# Patient Record
Sex: Male | Born: 1995 | Race: White | Hispanic: No | Marital: Single | Smoking: Never smoker
Health system: Southern US, Community
[De-identification: ages and names within clinical notes are randomized; demographics above are authoritative.]

## PROBLEM LIST (undated history)

## (undated) ENCOUNTER — Emergency Department (HOSPITAL_COMMUNITY): Discharge status: Against Medical Advice | Payer: 59

## (undated) DIAGNOSIS — G4700 Insomnia, unspecified: Secondary | ICD-10-CM

## (undated) DIAGNOSIS — I10 Essential (primary) hypertension: Secondary | ICD-10-CM

## (undated) DIAGNOSIS — F411 Generalized anxiety disorder: Secondary | ICD-10-CM

## (undated) DIAGNOSIS — R945 Abnormal results of liver function studies: Secondary | ICD-10-CM

## (undated) DIAGNOSIS — F329 Major depressive disorder, single episode, unspecified: Secondary | ICD-10-CM

## (undated) DIAGNOSIS — R45851 Suicidal ideations: Secondary | ICD-10-CM

## (undated) DIAGNOSIS — E669 Obesity, unspecified: Secondary | ICD-10-CM

## (undated) DIAGNOSIS — R7989 Other specified abnormal findings of blood chemistry: Secondary | ICD-10-CM

## (undated) DIAGNOSIS — N200 Calculus of kidney: Secondary | ICD-10-CM

## (undated) DIAGNOSIS — Q549 Hypospadias, unspecified: Secondary | ICD-10-CM

## (undated) DIAGNOSIS — F3132 Bipolar disorder, current episode depressed, moderate: Secondary | ICD-10-CM

## (undated) HISTORY — PX: LITHOTRIPSY: SUR834

## (undated) HISTORY — DX: Suicidal ideations: R45.851

## (undated) HISTORY — DX: Major depressive disorder, single episode, unspecified: F32.9

## (undated) HISTORY — DX: Hypospadias, unspecified: Q54.9

## (undated) HISTORY — PX: WISDOM TOOTH EXTRACTION: SHX21

## (undated) HISTORY — DX: Other specified abnormal findings of blood chemistry: R79.89

## (undated) HISTORY — PX: LIVER BIOPSY: SHX301

## (undated) HISTORY — DX: Abnormal results of liver function studies: R94.5

---
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
## 2014-08-16 ENCOUNTER — Ambulatory Visit: Payer: Managed Care, Other (non HMO) | Admitting: Medical

## 2014-08-20 ENCOUNTER — Encounter: Payer: Self-pay | Admitting: Internal Medicine

## 2014-08-20 ENCOUNTER — Ambulatory Visit (INDEPENDENT_AMBULATORY_CARE_PROVIDER_SITE_OTHER): Payer: 59 | Admitting: Internal Medicine

## 2014-08-20 ENCOUNTER — Ambulatory Visit (HOSPITAL_BASED_OUTPATIENT_CLINIC_OR_DEPARTMENT_OTHER)
Admission: RE | Admit: 2014-08-20 | Discharge: 2014-08-20 | Disposition: A | Discharge status: Home / Self Care | Payer: 59 | Source: Ambulatory Visit | Attending: Internal Medicine | Admitting: Internal Medicine

## 2014-08-20 VITALS — BP 126/78 | HR 97 | Temp 98.3°F | Ht 71.0 in | Wt 326.0 lb

## 2014-08-20 DIAGNOSIS — R079 Chest pain, unspecified: Secondary | ICD-10-CM

## 2014-08-20 DIAGNOSIS — J9811 Atelectasis: Secondary | ICD-10-CM | POA: Insufficient documentation

## 2014-08-20 NOTE — Progress Notes (Signed)
   Subjective:    Patient ID: [name], male    DOB: 08-29-1995, 19 y.o.   MRN: [id number]  DOS:  08/20/2014 Type of visit - description : Acute Interval history: He has 3 different pains, the one that triggered this office visit is the right-sided pain. The pain is located at the distal right rib cage, going on for the last 3 weeks, sometimes worse when he moves sometimes even when he is not moving, last 2 or 3 minutes, no change with eating.  Also having pain and the left lower quadrant of the abdomen since last year, see my note from 2015. Again symptoms are on and off, recently they are mild.  Also complained of an anterior upper chest pain when he lays on his back, no exertional symptoms, pain last about 10 minutes   Review of Systems No fever chills No difficulty breathing, no lower extremity edema or calf pain. Occasionally has nausea but no vomiting, no diarrhea or actual heartburn. No dysuria, gross hematuria difficulty urinating No rash.  Past Medical History  Diagnosis Date  . Major depression      was admited to [facility] x 1 week, [name]  is his psychiatrist-- Dx made 01-2007, started on meds then  . Hypospadias   . Liver function test abnormality     h/o  . Kidney stone     x4; 2 at age 17, lithotrypsy 16-2015  . Obesity     Past Surgical History  Procedure Laterality Date  . Wisdom tooth extraction      History   Social History  . Marital Status: Single    Spouse Name: N/A  . Number of Children: 0  . Years of Education: N/A   Occupational History  . student    Social History Main Topics  . Smoking status: Never Smoker   . Smokeless tobacco: Never Used  . Alcohol Use: No  . Drug Use: No  . Sexual Activity: Not on file   Other Topics Concern  . Not on file   Social History Narrative        Medication List    Notice  As of 08/20/2014 11:59 PM   You have not been prescribed any medications.         Objective:   Physical Exam    Abdominal:     BP 126/78 mmHg  Pulse 97  Temp(Src) 98.3 F (36.8 C) (Oral)  Ht  (1.803 m)  Wt 326 lb (147.873 kg)  BMI 45.49 kg/m2  SpO2 97% General:   Well developed, well nourished . NAD.  HEENT:  Normocephalic . Face symmetric, atraumatic Lungs:  CTA B Normal respiratory effort, no intercostal retractions, no accessory muscle use. Heart: RRR,  no murmur.  no pretibial edema bilaterally  Abdomen:  Not distended, soft, non-tender. No rebound or rigidity. No mass,organomegaly Skin: Not pale. Not jaundice Neurologic:  alert & oriented X3.  Speech normal, gait appropriate for age and unassisted Psych--  Cognition and judgment appear intact.  Cooperative with normal attention span and concentration.  Behavior appropriate. No anxious or depressed appearing.    Assessment & Plan:   Right-sided chest pain, chronic left lower abdominal pain, upper anterior chest pain. Multiple pains, exam is benign, for completeness we'll get a chest x-ray. If x-rays normal will recommend observation. Asked to call me if the pain increases,  he has a rash, right side pain becomes related to food, etc .

## 2014-08-20 NOTE — Progress Notes (Signed)
Pre visit review using our clinic review tool, if applicable. No additional management support is needed unless otherwise documented below in the visit note. 

## 2014-08-20 NOTE — Patient Instructions (Signed)
   Stop by the first floor and get the XR    Call if not gradually improving. Call if you have severe symptoms, a rash, problems with eating

## 2014-10-18 ENCOUNTER — Telehealth: Payer: Self-pay | Admitting: Behavioral Health

## 2014-10-18 NOTE — Telephone Encounter (Signed)
Unable to reach patient/mother at time of Pre-Visit Call.  Left message for patient/mother to return call when available.

## 2014-10-19 ENCOUNTER — Ambulatory Visit (INDEPENDENT_AMBULATORY_CARE_PROVIDER_SITE_OTHER): Payer: Managed Care, Other (non HMO) | Admitting: Internal Medicine

## 2014-10-19 ENCOUNTER — Encounter: Payer: Self-pay | Admitting: Internal Medicine

## 2014-10-19 VITALS — BP 124/76 | HR 91 | Temp 98.1°F | Ht 71.0 in | Wt 320.0 lb

## 2014-10-19 DIAGNOSIS — Z0000 Encounter for general adult medical examination without abnormal findings: Secondary | ICD-10-CM | POA: Insufficient documentation

## 2014-10-19 DIAGNOSIS — Z23 Encounter for immunization: Secondary | ICD-10-CM

## 2014-10-19 DIAGNOSIS — Z09 Encounter for follow-up examination after completed treatment for conditions other than malignant neoplasm: Secondary | ICD-10-CM

## 2014-10-19 LAB — CBC WITH DIFFERENTIAL/PLATELET
BASOS PCT: 1 % (ref 0–1)
Basophils Absolute: 0.1 10*3/uL (ref 0.0–0.1)
EOS PCT: 1 % (ref 0–5)
Eosinophils Absolute: 0.1 10*3/uL (ref 0.0–0.7)
HCT: 46.1 % (ref 39.0–52.0)
Hemoglobin: 15.6 g/dL (ref 13.0–17.0)
Lymphocytes Relative: 43 % (ref 12–46)
Lymphs Abs: 3.5 10*3/uL (ref 0.7–4.0)
MCH: 29.2 pg (ref 26.0–34.0)
MCHC: 33.8 g/dL (ref 30.0–36.0)
MCV: 86.3 fL (ref 78.0–100.0)
MONO ABS: 0.7 10*3/uL (ref 0.1–1.0)
MPV: 9.7 fL (ref 8.6–12.4)
Monocytes Relative: 9 % (ref 3–12)
Neutro Abs: 3.8 10*3/uL (ref 1.7–7.7)
Neutrophils Relative %: 46 % (ref 43–77)
Platelets: 303 10*3/uL (ref 150–400)
RBC: 5.34 MIL/uL (ref 4.22–5.81)
RDW: 13.8 % (ref 11.5–15.5)
WBC: 8.2 10*3/uL (ref 4.0–10.5)

## 2014-10-19 LAB — COMPREHENSIVE METABOLIC PANEL
ALT: 113 U/L — ABNORMAL HIGH (ref 9–46)
AST: 49 U/L — ABNORMAL HIGH (ref 10–40)
Albumin: 4.4 g/dL (ref 3.6–5.1)
Alkaline Phosphatase: 48 U/L (ref 40–115)
BUN: 11 mg/dL (ref 7–25)
CO2: 22 mmol/L (ref 20–31)
CREATININE: 0.67 mg/dL (ref 0.60–1.35)
Calcium: 9.2 mg/dL (ref 8.6–10.3)
Chloride: 104 mmol/L (ref 98–110)
Glucose, Bld: 79 mg/dL (ref 65–99)
POTASSIUM: 4.2 mmol/L (ref 3.5–5.3)
SODIUM: 136 mmol/L (ref 135–146)
TOTAL PROTEIN: 6.9 g/dL (ref 6.1–8.1)
Total Bilirubin: 0.6 mg/dL (ref 0.2–1.2)

## 2014-10-19 NOTE — Progress Notes (Signed)
Subjective:    Patient ID: [name], male    DOB: 1995/02/27, 19 y.o.   MRN: [id number]  DOS:  10/19/2014 Type of visit - description : cpx Interval history: no concerns    Review of Systems  Constitutional: No fever. No chills. No unexplained wt changes. No unusual sweats  HEENT: No dental problems, no ear discharge, no facial swelling, no voice changes. No eye discharge, no eye  redness , no  intolerance to light   Respiratory: No wheezing , no  difficulty breathing. No cough , no mucus production  Cardiovascular: No CP, no leg swelling , no  Palpitations  GI: no nausea, no vomiting, no diarrhea , no  abdominal pain.  No blood in the stools. No dysphagia, no odynophagia    Endocrine: No polyphagia, no polyuria , no polydipsia  GU: No dysuria, gross hematuria, difficulty urinating. No urinary urgency, no frequency.  Musculoskeletal: No joint swellings or unusual aches or pains  Skin: No change in the color of the skin, palor , no  Rash  Allergic, immunologic: No environmental allergies , no  food allergies  Neurological: No dizziness no  syncope. No headaches. No diplopia, no slurred, no slurred speech, no motor deficits, no facial  Numbness  Hematological: No enlarged lymph nodes, no easy bruising , no unusual bleedings  Psychiatry: No suicidal ideas, no hallucinations, no beavior problems, no confusion.  No unusual/severe anxiety, no depression    Past Medical History  Diagnosis Date  . Major depression (HCC)      was admited to [facility] x 1 week, [name]  is his psychiatrist-- Dx made 01-2007, started on meds then  . Hypospadias   . Liver function test abnormality     h/o  . Kidney stone     x4; 2 at age 65, lithotrypsy 62-2015  . Obesity     Past Surgical History  Procedure Laterality Date  . Wisdom tooth extraction      Social History   Social History  . Marital Status: Single    Spouse Name: N/A  . Number of Children: 0  . Years of  Education: N/A   Occupational History  . to start school in 01-2015 [employer] [facility])    Social History Main Topics  . Smoking status: Never Smoker   . Smokeless tobacco: Never Used  . Alcohol Use: No  . Drug Use: No  . Sexual Activity: Not on file   Other Topics Concern  . Not on file   Social History Narrative   Lives w/ parents, not working     Family History  Problem Relation Age of Onset  . Breast cancer Paternal Grandmother   . Prostate cancer Paternal Grandfather   . Diabetes Paternal Grandmother   . Diabetes Maternal Grandmother   . Heart disease Father   . Heart disease Paternal Grandfather   . Heart disease Paternal Grandmother   . Heart disease Maternal Grandfather   . Colon cancer Neg Hx        Medication List    Notice  As of 10/19/2014 11:59 PM   You have not been prescribed any medications.         Objective:   Physical Exam BP 124/76 mmHg  Pulse 91  Temp(Src) 98.1 F (36.7 C) (Oral)  Ht  (1.803 m)  Wt 320 lb (145.151 kg)  BMI 44.65 kg/m2  SpO2 97% General:   Well developed, well nourished . NAD.  HEENT:  Normocephalic . Face  symmetric, atraumatic  neck: No thyromegaly Lungs:  CTA B Normal respiratory effort, no intercostal retractions, no accessory muscle use. Heart: RRR,  no murmur.  no pretibial edema bilaterally  Abdomen:  Not distended, soft, non-tender. No rebound or rigidity. No mass,organomegaly Skin: Not pale. Not jaundice Neurologic:  alert & oriented X3.  Speech normal, gait appropriate for age and unassisted Psych--  Cognition and judgment appear intact.  Cooperative with normal attention span and concentration.  Behavior appropriate. No anxious or depressed appearing.     Assessment & Plan:   Assessment > Major depression  -- was admited to [facility] x 1 week, [name]  is his psychiatrist-- Dx made 01-2007, was rx meds then H/o Hypospadias H/o hypogonadism, pituitary insufficiency, used to see Dr. Fransico Michael or Dr  Everardo All NASH: H/o increase LFTs, saw GI 2014 >> Bx + for NASH, rx wt loss , vit e 800 u Urolithiasis --x4; 2 at age 41, lithotrypsy 01-2013  Plan Elita Boone: Encourage weight loss, recheck labs History of hypogonadism, pituitary insufficiency: Check labs, refer to endocrinology if needed RTC 6 months

## 2014-10-19 NOTE — Progress Notes (Signed)
Pre visit review using our clinic review tool, if applicable. No additional management support is needed unless otherwise documented below in the visit note. 

## 2014-10-19 NOTE — Assessment & Plan Note (Addendum)
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 Will check a cholesterol panel when he comes back fasting in 6 months Diet and exercise discussed Consulate about safe sex and STE

## 2014-10-19 NOTE — Patient Instructions (Addendum)
Get your blood work before you leave   Come back for your next shot in one month and again in 6 months    Next visit  for a  routine checkup in 6 months, fasting.   Please schedule an appointment at the front desk    Testicular Self-Exam A self-examination of your testicles involves looking at and feeling your testicles for abnormal lumps or swelling. Several things can cause swelling, lumps, or pain in your testicles. Some of these causes are:  Injuries.  Inflammation.  Infection.  Accumulation of fluids around your testicle (hydrocele).  Twisted testicles (testicular torsion).  Testicular cancer. Self-examination of the testicles and groin areas may be advised if you are at risk for testicular cancer. Risks for testicular cancer include:  An undescended testicle (cryptorchidism).  A history of previous testicular cancer.  A family history of testicular cancer. The testicles are easiest to examine after warm baths or showers and are more difficult to examine when you are cold. This is because the muscles attached to the testicles retract and pull them up higher or into the abdomen. Follow these steps while you are standing:  Hold your penis away from your body.  Roll one testicle between your thumb and forefinger, feeling the entire testicle.  Roll the other testicle between your thumb and forefinger, feeling the entire testicle. Feel for lumps, swelling, or discomfort. A normal testicle is egg shaped and feels firm. It is smooth and not tender. The spermatic cord can be felt as a firm spaghetti-like cord at the back of your testicle. It is also important to examine the crease between the front of your leg and your abdomen. Feel for any bumps that are tender. These could be enlarged lymph nodes.    This information is not intended to replace advice given to you by your health care provider. Make sure you discuss any questions you have with your health care provider.    Document Released: 04/06/2000 Document Revised: 08/31/2012 Document Reviewed: 06/20/2012 Elsevier Interactive Patient Education 2016 [employer].     Safe Sex Safe sex is about reducing the risk of giving or getting a sexually transmitted disease (STD). STDs are spread through sexual contact involving the genitals, mouth, or rectum. Some STDs can be cured and others cannot. Safe sex can also prevent unintended pregnancies.  WHAT ARE SOME SAFE SEX PRACTICES?  Limit your sexual activity to only one partner who is having sex with only you.  Talk to your partner about his or her past partners, past STDs, and drug use.  Use a condom every time you have sexual intercourse. This includes vaginal, oral, and anal sexual activity. Both females and males should wear condoms during oral sex. Only use latex or polyurethane condoms and water-based lubricants. Using petroleum-based lubricants or oils to lubricate a condom will weaken the condom and increase the chance that it will break. The condom should be in place from the beginning to the end of sexual activity. Wearing a condom reduces, but does not completely eliminate, your risk of getting or giving an STD. STDs can be spread by contact with infected body fluids and skin.  Get vaccinated for hepatitis B and HPV.  Avoid alcohol and recreational drugs, which can affect your judgment. You may forget to use a condom or participate in high-risk sex.  For females, avoid douching after sexual intercourse. Douching can spread an infection farther into the reproductive tract.  Check your body for signs of sores, blisters, rashes,  or unusual discharge. See your health care provider if you notice any of these signs.  Avoid sexual contact if you have symptoms of an infection or are being treated for an STD. If you or your partner has herpes, avoid sexual contact when blisters are present. Use condoms at all other times.  If you are at risk of being  infected with HIV, it is recommended that you take a prescription medicine daily to prevent HIV infection. This is called pre-exposure prophylaxis (PrEP). You are considered at risk if:  You are a man who has sex with other men (MSM).  You are a heterosexual man or woman who is sexually active with more than one partner.  You take drugs by injection.  You are sexually active with a partner who has HIV.  Talk with your health care provider about whether you are at high risk of being infected with HIV. If you choose to begin PrEP, you should first be tested for HIV. You should then be tested every 3 months for as long as you are taking PrEP.  See your health care provider for regular screenings, exams, and tests for other STDs. Before having sex with a new partner, each of you should be screened for STDs and should talk about the results with each other. WHAT ARE THE BENEFITS OF SAFE SEX?   There is less chance of getting or giving an STD.  You can prevent unwanted or unintended pregnancies.  By discussing safe sex concerns with your partner, you may increase feelings of intimacy, comfort, trust, and honesty between the two of you.   This information is not intended to replace advice given to you by your health care provider. Make sure you discuss any questions you have with your health care provider.   Document Released: 02/06/2004 Document Revised: 01/19/2014 Document Reviewed: 06/22/2011 Elsevier Interactive Patient Education [employer]

## 2014-10-20 DIAGNOSIS — Z09 Encounter for follow-up examination after completed treatment for conditions other than malignant neoplasm: Secondary | ICD-10-CM | POA: Insufficient documentation

## 2014-10-20 LAB — TSH: TSH: 1.241 u[IU]/mL (ref 0.350–4.500)

## 2014-10-20 NOTE — Assessment & Plan Note (Signed)
[name]: Encourage weight loss, recheck labs History of hypogonadism, pituitary insufficiency: Check labs, refer to endocrinology if needed RTC 6 months

## 2014-10-22 LAB — TESTOSTERONE, FREE, TOTAL, SHBG
SEX HORMONE BINDING: 25 nmol/L (ref 10–50)
TESTOSTERONE-% FREE: 2.3 % (ref 1.6–2.9)
TESTOSTERONE: 289 ng/dL — AB (ref 300–890)
Testosterone, Free: 66 pg/mL (ref 47.0–244.0)

## 2014-11-19 IMAGING — US US BIOPSY
1 series · 13 of 19 positions shown · non-contrast
Comparison: CT abdomen pelvis - 04/14/2012

INDICATION: Elevated LFTs and AMA, evaluate for PBC, PSC,
autoimmune hepatitis

ULTRASOUND GUIDED LIVER BIOPSY

[Series 1: us biopsy · 0.32mm/px · 13 of 19 slices shown]
[im 1/19]
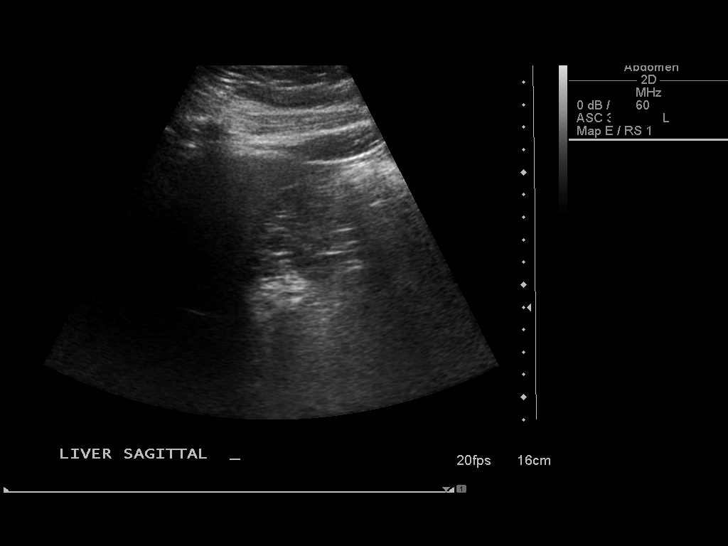
[im 3/19]
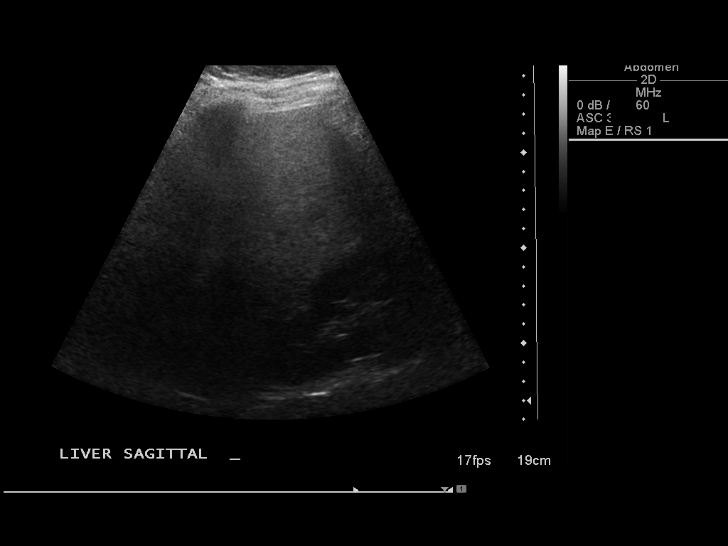
[im 4/19]
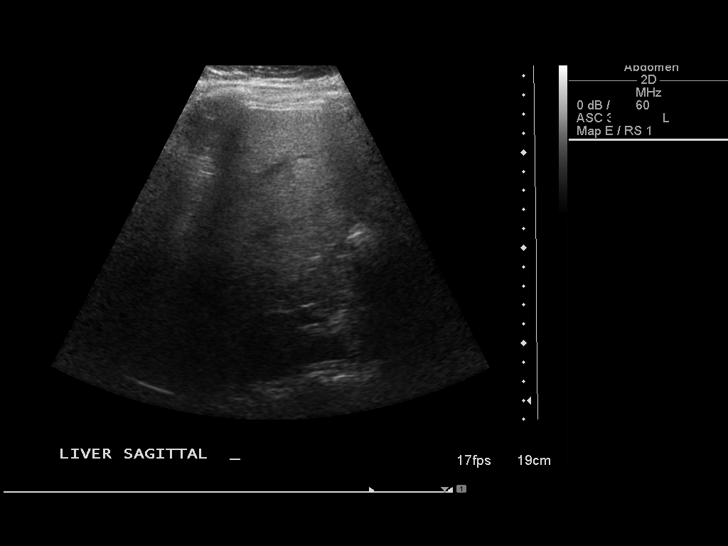
[im 6/19]
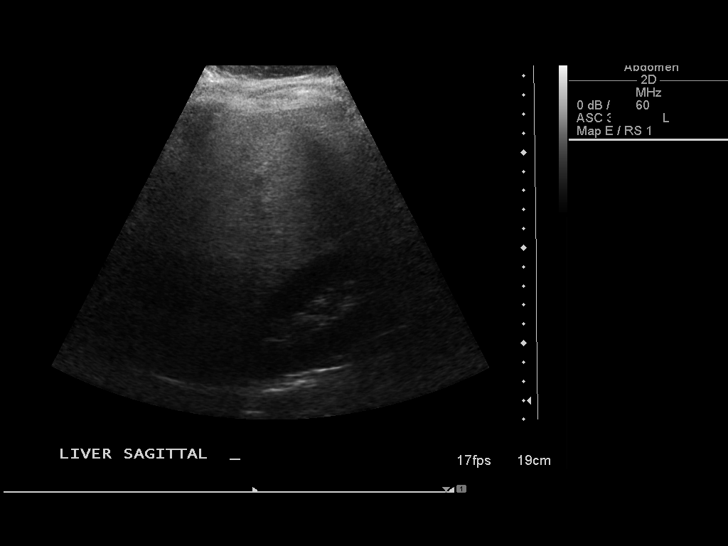
[im 7/19]
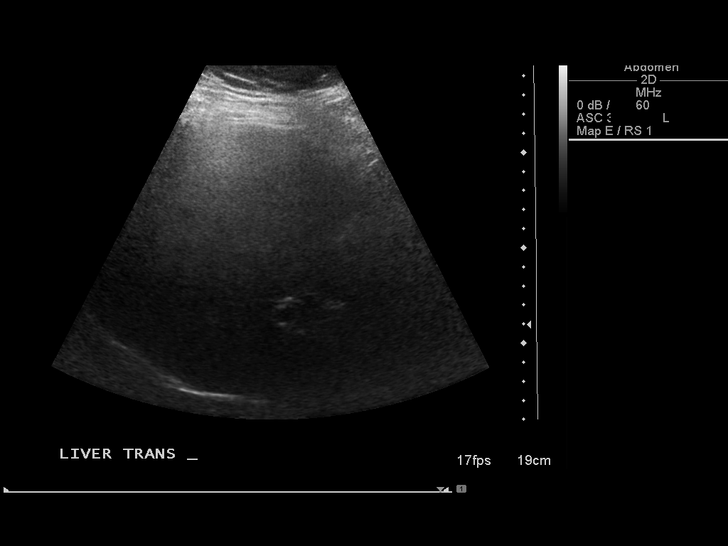
[im 9/19]
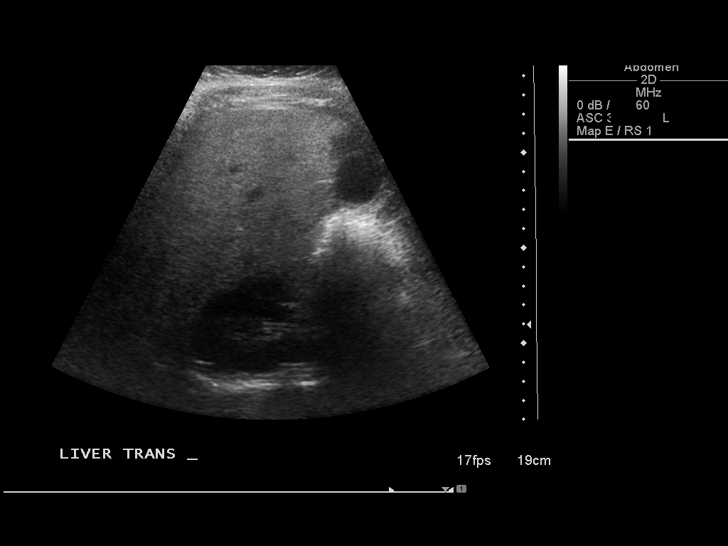
[im 10/19]
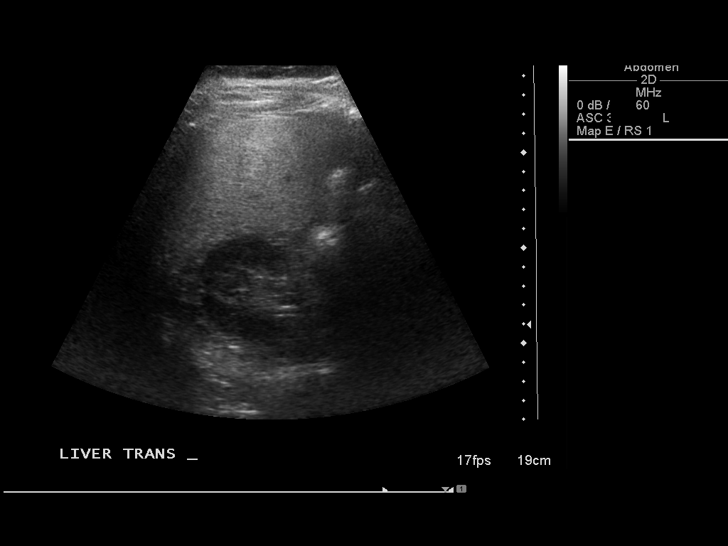
[im 11/19]
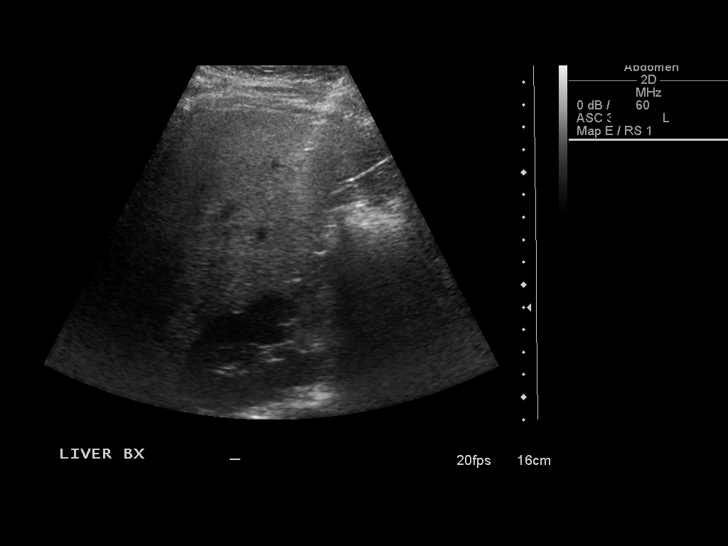
[im 13/19]
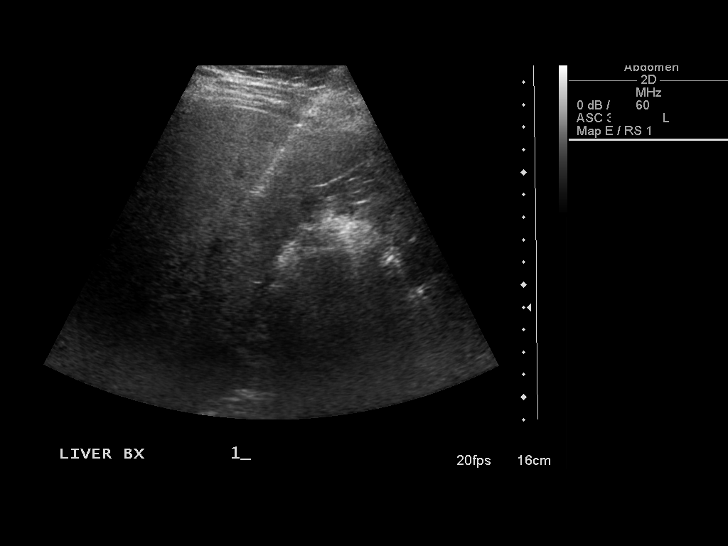
[im 14/19]
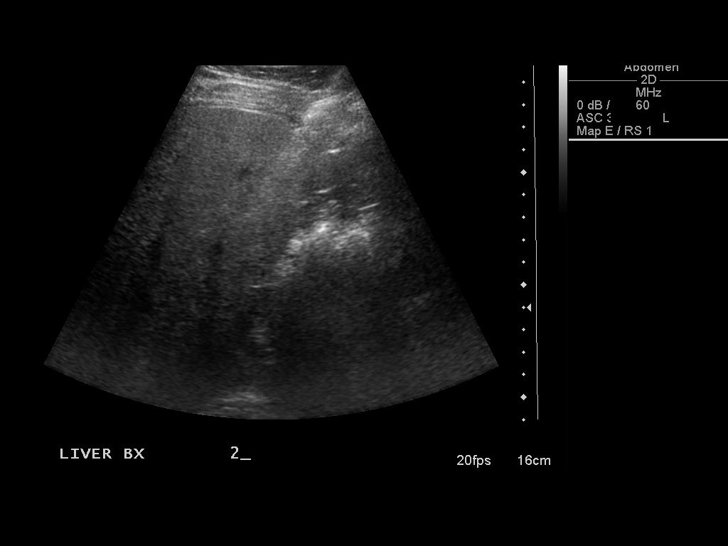
[im 16/19]
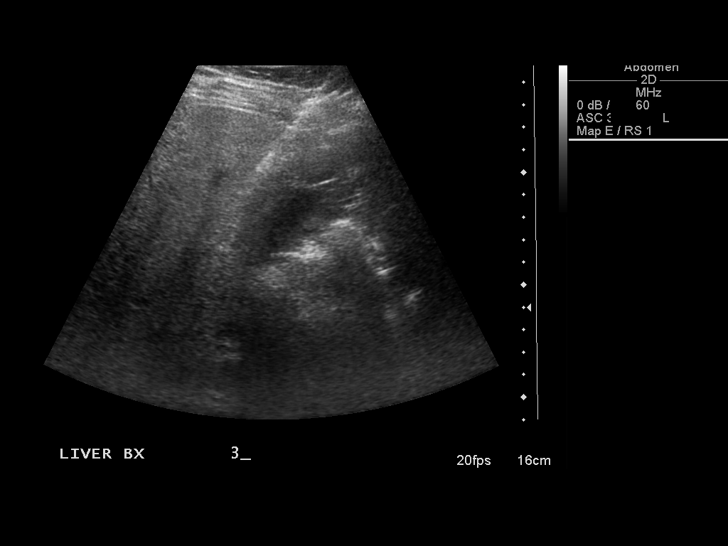
[im 17/19]
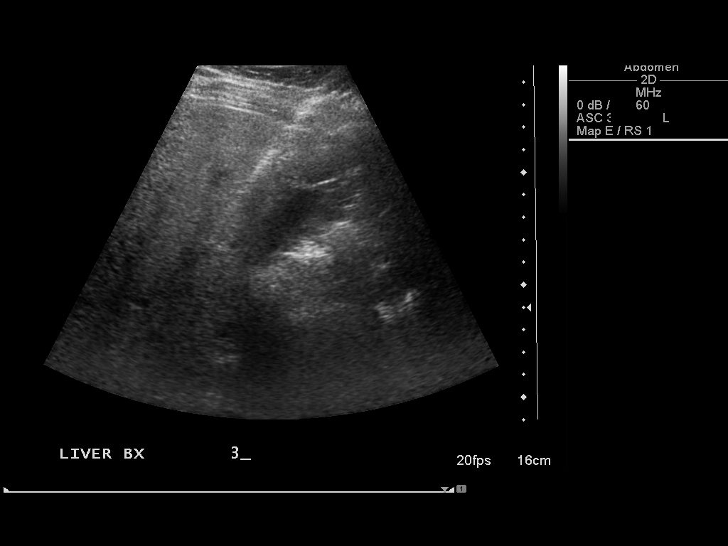
[im 19/19]
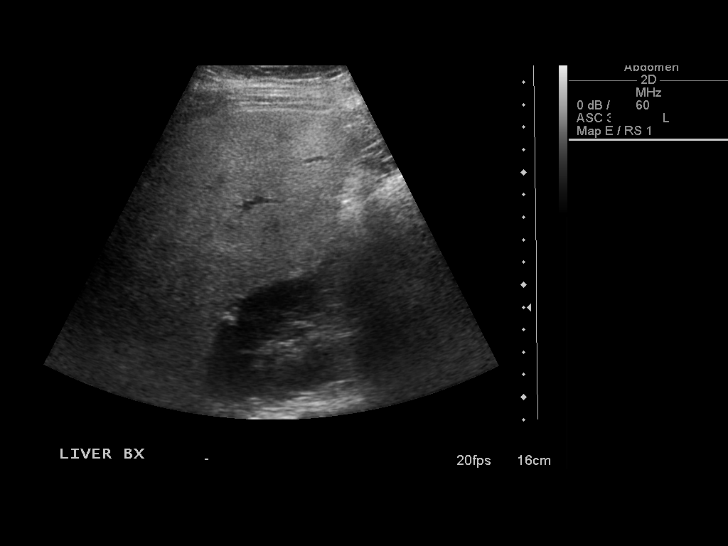

[13 of 19 positions shown; findings below may reference images not displayed]

Medications: Fentanyl 150 mcg IV; Versed 2 mg IV

Total Moderate Sedation time: 15 minutes

Complications: None immediate

Procedure:

Informed written consent was obtained from the patient after a
discussion of the risks, benefits and alternatives to treatment.
The patient understands and consents to the procedure.  A timeout
was performed prior to the initiation of the procedure.

Ultrasound scanning was performed of the right upper abdominal
quadrant and the procedure was planned.  The right upper abdomen
was prepped and draped in the usual sterile fashion.  The overlying
soft tissues were anesthetized with 1% lidocaine with epinephrine.
A 17 gauge, 6.8 cm co-axial needle was advanced into a peripheral
aspect of the right lobe of the liver and 3 core biopsies were
obtained with an 18 gauge core device under direct ultrasound
guidance.

The co-axial needle was removed and hemostasis was obtained with
manual compression.  Post procedural scanning was negative for
definitive area of hemorrhage.  A dressing was placed.  The patient
tolerated the procedure well without immediate post procedural
complication.
IMPRESSION: Technically successful ultrasound guided liver biopsy.

## 2014-11-20 ENCOUNTER — Ambulatory Visit: Payer: Managed Care, Other (non HMO)

## 2014-11-23 ENCOUNTER — Ambulatory Visit (INDEPENDENT_AMBULATORY_CARE_PROVIDER_SITE_OTHER): Payer: Managed Care, Other (non HMO)

## 2014-11-23 DIAGNOSIS — Z23 Encounter for immunization: Secondary | ICD-10-CM

## 2015-01-13 DIAGNOSIS — R45851 Suicidal ideations: Secondary | ICD-10-CM

## 2015-01-13 HISTORY — DX: Suicidal ideations: R45.851

## 2015-02-14 ENCOUNTER — Encounter: Payer: Self-pay | Admitting: Internal Medicine

## 2015-02-14 ENCOUNTER — Ambulatory Visit (INDEPENDENT_AMBULATORY_CARE_PROVIDER_SITE_OTHER): Payer: Managed Care, Other (non HMO) | Admitting: Internal Medicine

## 2015-02-14 VITALS — BP 116/76 | HR 102 | Temp 97.8°F | Ht 71.0 in | Wt 323.4 lb

## 2015-02-14 DIAGNOSIS — M25562 Pain in left knee: Secondary | ICD-10-CM

## 2015-02-14 DIAGNOSIS — R7303 Prediabetes: Secondary | ICD-10-CM

## 2015-02-14 MED ORDER — MELOXICAM 15 MG PO TABS: 15.0000 mg | ORAL_TABLET | Freq: Every day | ORAL | Status: DC

## 2015-02-14 NOTE — Progress Notes (Signed)
Subjective:    Patient ID: [name], male    DOB: 01-13-1995, 19 y.o.   MRN: [id number]  DOS:  02/14/2015 Type of visit - description :  Acute  Interval history: Left leg pain most days x the last 6-7 months. Located at the L calf and sometimes L pretibial area, mostly at rest, no claudication. Denies injury, swelling, rash. No back pain or lower extremity paresthesias. No motor deficits  Morbid obesity: Has gained 23 pounds.  Wt Readings from Last 3 Encounters:  02/14/15 323 lb 6 oz (146.682 kg)  10/19/14 320 lb (145.151 kg)  08/20/14 326 lb (147.873 kg)    Review of Systems   Past Medical History  Diagnosis Date  . Major depression (HCC)      was admited to [facility] x 1 week, [name]  is his psychiatrist-- Dx made 01-2007, started on meds then  . Hypospadias   . Liver function test abnormality     h/o  . Kidney stone     x4; 2 at age 66, lithotrypsy 90-2015  . Obesity     Past Surgical History  Procedure Laterality Date  . Wisdom tooth extraction      Social History   Social History  . Marital Status: Single    Spouse Name: N/A  . Number of Children: 0  . Years of Education: N/A   Occupational History  . to start school in 01-2015 [employer])    Social History Main Topics  . Smoking status: Never Smoker   . Smokeless tobacco: Never Used  . Alcohol Use: No  . Drug Use: No  . Sexual Activity: Not on file   Other Topics Concern  . Not on file   Social History Narrative   Lives w/ parents, not working        Medication List       This list is accurate as of: 02/14/15  8:11 PM.  Always use your most recent med list.               meloxicam 15 MG tablet  Commonly known as:  MOBIC  Take 1 tablet (15 mg total) by mouth daily.           Objective:   Physical Exam BP 116/76 mmHg  Pulse 102  Temp(Src) 97.8 F (36.6 C) (Oral)  Ht  (1.803 m)  Wt 323 lb 6 oz (146.682 kg)  BMI 45.12 kg/m2  SpO2 98% General:   Well developed, well  nourished . NAD.  HEENT:  Normocephalic . Face symmetric, atraumatic MSK: Back no TTP. Calves symmetric, no TTP No edema, good pedal pulses. Skin: Not pale. Not jaundice Neurologic:  alert & oriented X3.  Speech normal, gait appropriate for age and unassisted. DTRs and motor exam symmetric Psych--  Cognition and judgment appear intact.  Cooperative with normal attention span and concentration.  Behavior appropriate. No anxious or depressed appearing.      Assessment & Plan:   Assessment: Major depression  -- was admited to [facility] x 1 week, [name]  is his psychiatrist-- Dx made 01-2007, was rx meds then Morbid obesity H/o Hypospadias H/o hypogonadism, pituitary insufficiency, used to see Dr. Fransico Michael or Dr Everardo All NASH: H/o increase LFTs, saw GI 2014 >> Bx + for NASH, rx wt loss , vit e 800 u Urolithiasis --x4; 2 at age 64, lithotrypsy 01-2013  PLAN: Left leg pain: Normal vascular, neurological exam, most likely a MSK issue, sprain?. Recommend Mobic for  2 weeks, GI precautions discussed. Call if not better. Morbid obesity: Continue gaining weight, counseled about diet, refer to dietitian.

## 2015-02-14 NOTE — Patient Instructions (Signed)
Take meloxicam 15 mg oe tablet daily for 2 weeks. Always take it with food because may cause gastritis and ulcers.  If you notice nausea, stomach pain, change in the color of stools --->  Stop the medicine and let us know    if yo are not better by the end of the 2 weeks let us know  We are also referring you to a dietitian .

## 2015-02-14 NOTE — Progress Notes (Signed)
Pre visit review using our clinic review tool, if applicable. No additional management support is needed unless otherwise documented below in the visit note. 

## 2015-04-19 ENCOUNTER — Ambulatory Visit: Payer: Managed Care, Other (non HMO)

## 2015-04-19 ENCOUNTER — Ambulatory Visit: Payer: Managed Care, Other (non HMO) | Admitting: Internal Medicine

## 2015-04-25 ENCOUNTER — Ambulatory Visit (INDEPENDENT_AMBULATORY_CARE_PROVIDER_SITE_OTHER): Payer: Managed Care, Other (non HMO) | Admitting: *Deleted

## 2015-04-25 DIAGNOSIS — Z23 Encounter for immunization: Secondary | ICD-10-CM | POA: Diagnosis not present

## 2015-04-25 NOTE — Progress Notes (Signed)
Pre visit review using our clinic review tool, if applicable. No additional management support is needed unless otherwise documented below in the visit note.  Pt here for 3rd Gardasil injection. Pt tolerated injection well. No signs/symptoms of a reaction upon leaving the clinic.   Starla Linkarolyn J Allysha Tryon, RN

## 2015-12-16 ENCOUNTER — Telehealth: Payer: Self-pay | Admitting: Internal Medicine

## 2015-12-16 DIAGNOSIS — F329 Major depressive disorder, single episode, unspecified: Secondary | ICD-10-CM

## 2015-12-16 NOTE — Telephone Encounter (Signed)
Referral placed.

## 2015-12-16 NOTE — Telephone Encounter (Signed)
Patient's mother called stating that patient needs a referral to a psychiatrist. She would like him to go see, Dr. Ellison CarwinWilliam Hickling. Please advise   Mother's phone: [phone]  Dr. Chrissie NoaWilliam Hickling's fax: [phone]

## 2015-12-16 NOTE — Telephone Encounter (Signed)
Unable to place referrals to Psychiatry, Pt would need to self refer.

## 2015-12-16 NOTE — Telephone Encounter (Signed)
Please place referral and I will fax over since that office is requesting.

## 2015-12-16 NOTE — Telephone Encounter (Signed)
Called and spoke with patient's mother, Felicity PellegriniBillie Davidson, and confirmed that psychiatry office requested a referral. She stated that they did. Can you maybe help with this?    Psychiatry Phone: [phone]

## 2015-12-17 NOTE — Telephone Encounter (Signed)
Referral faxed again.

## 2015-12-17 NOTE — Telephone Encounter (Signed)
Patient's mother called stating that she spoke with Dr. Darl HouseholderHickling's office today and they do not have this referral. Please advise.    Fax: [phone]

## 2015-12-20 ENCOUNTER — Ambulatory Visit (INDEPENDENT_AMBULATORY_CARE_PROVIDER_SITE_OTHER): Payer: Managed Care, Other (non HMO) | Admitting: Internal Medicine

## 2015-12-20 VITALS — BP 124/80 | HR 123 | Temp 97.7°F | Resp 14 | Ht 71.0 in | Wt 335.1 lb

## 2015-12-20 DIAGNOSIS — F32A Depression, unspecified: Secondary | ICD-10-CM

## 2015-12-20 DIAGNOSIS — Z23 Encounter for immunization: Secondary | ICD-10-CM | POA: Diagnosis not present

## 2015-12-20 DIAGNOSIS — F329 Major depressive disorder, single episode, unspecified: Secondary | ICD-10-CM

## 2015-12-20 DIAGNOSIS — F418 Other specified anxiety disorders: Secondary | ICD-10-CM | POA: Diagnosis not present

## 2015-12-20 DIAGNOSIS — S61519D Laceration without foreign body of unspecified wrist, subsequent encounter: Secondary | ICD-10-CM

## 2015-12-20 DIAGNOSIS — F419 Anxiety disorder, unspecified: Principal | ICD-10-CM

## 2015-12-20 MED ORDER — CEPHALEXIN 500 MG PO CAPS: 500.0000 mg | ORAL_CAPSULE | Freq: Four times a day (QID) | ORAL | 0 refills | Status: DC

## 2015-12-20 NOTE — Progress Notes (Signed)
Pre visit review using our clinic review tool, if applicable. No additional management support is needed unless otherwise documented below in the visit note. 

## 2015-12-20 NOTE — Progress Notes (Signed)
Bilateral forearms covered w/ Tefla pad (8''x 3'') and 4x4 sterile gauze.

## 2015-12-20 NOTE — Patient Instructions (Addendum)
Take the antibiotic as prescribed  Keep the area clean and dry. Use a  over-the-counter antibiotic ointment   If the redness is not getting better, you have fever, chills or you see any discharge: Call the office  Keep the appointment  to see your psychiatrist next week and continue taking BuSpar  If the suicidal ideas resurface, look  for help immediately. Go to the ER or call the  helpline   Find Help 24/7 Call our 24-hour HelpLine at [phone] or [phone]

## 2015-12-20 NOTE — Progress Notes (Signed)
Subjective:    Patient ID: [name], male    DOB: [birth year], 20 y.o.   MRN: [id number]  DOS:  12/20/2015 Type of visit - description : er f/u Interval history: Patient lost his grandfather in April 2017, has been more depressed since then w/ occasional suicidal ideas. He also has anxiety. Symptoms increased lately, he was very anxious and he cut his wrist as a way to 'calm himself down". Went to an outside ER 12/11/2015, lacerations were staple, he is here for follow-up. His BP and heart rate were elevated.     Review of Systems Since he left the ER, started BuSpar and he is feeling emotionally better, still obviously somewhat depressed and anxious but denies recent suicidal ideas. He did mention that he feels weird sometimes were with BuSpar. Slightly restless.  Thinks  the laceration is getting infected, has noticed some redness. No discharge or fever.   Past Medical History:  Diagnosis Date  . Hypospadias   . Kidney stone    x4; 2 at age 20, lithotrypsy 651-2015  . Liver function test abnormality    h/o  . Major depression     [facility], [name]  is his psychiatrist-- Dx made 01-2007, started on meds then  . Obesity   . Suicidal ideations 2017   cutting behavior    Past Surgical History:  Procedure Laterality Date  . LITHOTRIPSY    . LIVER BIOPSY    . WISDOM TOOTH EXTRACTION      Social History   Social History  . Marital status: Single    Spouse name: N/A  . Number of children: 0  . Years of education: N/A   Occupational History  . to start school in 01-2015 [employer])    Social History Main Topics  . Smoking status: Never Smoker  . Smokeless tobacco: Never Used  . Alcohol use No  . Drug use: No  . Sexual activity: Not on file   Other Topics Concern  . Not on file   Social History Narrative   Lost GF 04-2015   Currently lives w/ his father or GM    not working but takes care og GM        Medication List       Accurate  as of 12/20/15 11:59 PM. Always use your most recent med list.          busPIRone 15 MG tablet Commonly known as:  BUSPAR Take 1 mg by mouth 3 (three) times daily.   cephALEXin 500 MG capsule Commonly known as:  KEFLEX Take 1 capsule (500 mg total) by mouth 4 (four) times daily.          Objective:   Physical Exam BP 124/80 (BP Location: Left Arm, Patient Position: Sitting, Cuff Size: Normal)   Pulse (!) 123   Temp 97.7 F (36.5 C) (Oral)   Resp 14   Ht 5\' 11"  (1.803 m)   Wt (!) 335 lb 2 oz (152 kg)   SpO2 98%   BMI 46.74 kg/m  General:   Well developed, well nourished . NAD.  HEENT:  Normocephalic . Face symmetric, atraumatic Skin: Mild redness at the right forearm proximal to the laceration. See picture. No evidence of abscess on exam. Neurologic:  alert & oriented X3.  Speech normal, gait appropriate for age and unassisted Psych--  Cognition and judgment appear intact.  Cooperative with normal attention span and concentration.  Behavior appropriate. Slightly  depressed appearing, no anxious.          Assessment & Plan:    Assessment: Major depression  -- [facility], [name]  is his psychiatrist-- Dx made 01-2007, was rx meds then Morbid obesity H/o Hypospadias H/o hypogonadism, pituitary insufficiency, used to see [name] or [name] NASH: H/o increase LFTs, saw GI 2014 >> Bx + for NASH, rx wt loss , vit e 800 u Urolithiasis --x4; 2 at age 20, lithotrypsy 01-2013  PLAN: Anxiety depression: Chronic problem, worse since April 2017 when he lost his grandfather. + Suicidal ideas prior to the ER visit 12/11/2015; asked  if he is having any recent suicidal ideas and he said no, I asked if is safe to go home and he said definitively  yes. Plans to see psychiatry next week, has an appointment.  "I need to learn how to deal with my feelings better". Plan: Continue BuSpar, see psychiatry, knows to seek for help if suicidal ideas  resurface. Lacerations: They are essentially healed, all staples removed. He may develop developing cellulitis of the right forearm. Recommend Keflex, see instructions.

## 2015-12-21 NOTE — Assessment & Plan Note (Signed)
Anxiety depression: Chronic problem, worse since April 2017 when he lost his grandfather. + Suicidal ideas prior to the ER visit 12/11/2015; asked  if he is having any recent suicidal ideas and he said no, I asked if is safe to go home and he said definitively  yes. Plans to see psychiatry next week, has an appointment.  "I need to learn how to deal with my feelings better". Plan: Continue BuSpar, see psychiatry, knows to seek for help if suicidal ideas resurface. Lacerations: They are essentially healed, all staples removed. He may develop developing cellulitis of the right forearm. Recommend Keflex, see instructions.

## 2016-02-25 ENCOUNTER — Encounter (HOSPITAL_COMMUNITY): Payer: Self-pay

## 2016-02-25 ENCOUNTER — Emergency Department (HOSPITAL_COMMUNITY): Payer: Managed Care, Other (non HMO)

## 2016-02-25 ENCOUNTER — Emergency Department (HOSPITAL_COMMUNITY)
Admission: EM | Admit: 2016-02-25 | Discharge: 2016-02-25 | Disposition: A | Discharge status: Home / Self Care | Payer: Managed Care, Other (non HMO) | Attending: Emergency Medicine | Admitting: Emergency Medicine

## 2016-02-25 DIAGNOSIS — S61412A Laceration without foreign body of left hand, initial encounter: Secondary | ICD-10-CM

## 2016-02-25 DIAGNOSIS — Y939 Activity, unspecified: Secondary | ICD-10-CM | POA: Insufficient documentation

## 2016-02-25 DIAGNOSIS — W260XXA Contact with knife, initial encounter: Secondary | ICD-10-CM | POA: Insufficient documentation

## 2016-02-25 DIAGNOSIS — Y92000 Kitchen of unspecified non-institutional (private) residence as  the place of occurrence of the external cause: Secondary | ICD-10-CM | POA: Diagnosis not present

## 2016-02-25 DIAGNOSIS — Y999 Unspecified external cause status: Secondary | ICD-10-CM | POA: Insufficient documentation

## 2016-02-25 DIAGNOSIS — Z79899 Other long term (current) drug therapy: Secondary | ICD-10-CM | POA: Diagnosis not present

## 2016-02-25 MED ORDER — IBUPROFEN 800 MG PO TABS: 800.0000 mg | ORAL_TABLET | Freq: Three times a day (TID) | ORAL | 0 refills | Status: DC | PRN

## 2016-02-25 MED ORDER — LIDOCAINE-EPINEPHRINE (PF) 2 %-1:200000 IJ SOLN
20.0000 mL | Freq: Once | INTRAMUSCULAR | Status: AC
  Administered 2016-02-25: 20 mL
  Filled 2016-02-25: qty 20

## 2016-02-25 NOTE — ED Triage Notes (Signed)
Pt brought in by EMS after he was sharpening a kitchen knife and it got away from him and cut the back of his left hand  Dressing in place  Bleeding controlled  Pt has full range of motion and cap refill is good to left hand

## 2016-02-25 NOTE — ED Provider Notes (Signed)
[facility]-EMERGENCY DEPT Provider Note   CSN: [id number] Arrival date & time: 02/25/16  0024  By signing my name below, I, Alyssa GroveMartin Green, attest that this documentation has been prepared under the direction and in the presence of [facility] Endoscopy CenterJaime Ward, PA-C. Electronically Signed: Alyssa GroveMartin Green, ED Scribe. 02/25/16. 12:49 AM.  History   Chief Complaint Chief Complaint  Patient presents with  . Laceration   The history is provided by the patient. No language interpreter was used.   HPI Comments: [name] is a right hand dominant 21 y.o. male who presents to the Emergency Department complaining of a laceration to the dorsum of the left hand s/p hand injury at  11:30 PM. Pt states today he was attempting to sharpen a knife when it slipped and cut the back of his left hand. Last tetanus vaccination was approximately 1 year ago per patient. He denies numbness and tingling and intentional self injury.  Past Medical History:  Diagnosis Date  . Hypospadias   . Kidney stone    x4; 2 at age 21, lithotrypsy 661-2015  . Liver function test abnormality    h/o  . Major depression     [facility], [name]  is his psychiatrist-- Dx made 01-2007, started on meds then  . Obesity   . Suicidal ideations 2017   cutting behavior    Patient Active Problem List   Diagnosis Date Noted  . PCP NOTES >>> 10/20/2014  . Annual physical exam 10/19/2014  . Urolithiasis 10/27/2013  . Obesity, unspecified 07/27/2012  . RLQ abdominal pain 04/14/2012  . Pre-diabetes 06/24/2010  . BACK PAIN 05/06/2009  . TENDINITIS, LEFT HAND 10/30/2008  . HYPOGONADISM 10/04/2008  . NASH (nonalcoholic steatohepatitis) 05/23/2008  . 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 GYNECOMASTIA 03/30/2008  . ABNORMAL WEIGHT GAIN 03/30/2008  . Anxiety and depression 03/12/2008  . PERSONAL HISTORY OF HYPOSPADIAS 03/12/2008    Past Surgical History:  Procedure Laterality Date  . LITHOTRIPSY    . LIVER BIOPSY    . WISDOM  TOOTH EXTRACTION         Home Medications    Prior to Admission medications   Medication Sig Start Date End Date Taking? Authorizing Provider  cephALEXin (KEFLEX) 500 MG capsule Take 1 capsule (500 mg total) by mouth 4 (four) times daily. 12/20/15   Wanda PlumpJose E Paz, MD  ibuprofen (ADVIL,MOTRIN) 800 MG tablet Take 1 tablet (800 mg total) by mouth every 8 (eight) hours as needed. 02/25/16   Chase PicketJaime Pilcher Ward, PA-C    Family History Family History  Problem Relation Age of Onset  . Breast cancer Paternal Grandmother   . Diabetes Paternal Grandmother   . Heart disease Paternal Grandmother   . Prostate cancer Paternal Grandfather   . Heart disease Paternal Grandfather   . Diabetes Maternal Grandmother   . Heart disease Father   . Heart disease Maternal Grandfather   . Colon cancer Neg Hx     Social History Social History  Substance Use Topics  . Smoking status: Never Smoker  . Smokeless tobacco: Never Used  . Alcohol use Yes     Comment: occasional/once a month     Allergies   Patient has no known allergies.   Review of Systems Review of Systems  Skin: Positive for wound.  Neurological: Negative for numbness.  Psychiatric/Behavioral: Negative for self-injury and suicidal ideas.  All other systems reviewed and are negative.  Physical Exam Updated Vital Signs BP 142/99 (BP Location: Left  Arm)   Pulse 105   Temp 98 F (36.7 C) (Oral)   Resp 16   Ht 6\' 1"  (1.854 m)   Wt (!) 145.2 kg   SpO2 98%   BMI 42.22 kg/m   Physical Exam  Constitutional: He is oriented to person, place, and time. He appears well-developed and well-nourished. No distress.  HENT:  Head: Normocephalic and atraumatic.  Neck: Neck supple.  Cardiovascular: Normal rate, regular rhythm and normal heart sounds.   No murmur heard. Pulmonary/Chest: Effort normal and breath sounds normal. No respiratory distress.  Musculoskeletal:  Left hand with full ROM without pain. Good grip strength and pincer  grasp.  Sensation intact to radial, median and ulnar nerve distributions. 2+ radial pulse.  Neurological: He is alert and oriented to person, place, and time.  Skin: Skin is warm and dry.  5.5 cm laceration to left dorsum of the hand  Nursing note and vitals reviewed.  ED Treatments / Results  DIAGNOSTIC STUDIES: Oxygen Saturation is 97% on RA, normal by my interpretation.    COORDINATION OF CARE: 12:48 AM Discussed treatment plan with pt at bedside which includes DG Hand left and laceration repair and pt agreed to plan.  Labs (all labs ordered are listed, but only abnormal results are displayed) Labs Reviewed - No data to display  EKG  EKG Interpretation None       Radiology Dg Hand Complete Left  Result Date: 02/25/2016 CLINICAL DATA:  Dorsal hand laceration at the first and second metacarpal. EXAM: LEFT HAND - COMPLETE 3+ VIEW COMPARISON:  None. FINDINGS: There is no evidence of fracture or dislocation. There is no evidence of arthropathy or other focal bone abnormality. Soft tissue laceration over the dorsum of the hand at the midcarpal level. No radiopaque foreign body. IMPRESSION: Soft tissue laceration over the dorsum of the hand without radiopaque foreign body nor bony involvement. Electronically Signed   By: Tollie Eth M.D.   On: 02/25/2016 01:12    Procedures .Marland KitchenLaceration Repair Date/Time: 02/25/2016 1:26 AM Performed by: Janyth Contes Authorized by: Janyth Contes   Consent:    Consent obtained:  Verbal   Consent given by:  Patient   Alternatives discussed:  No treatment Anesthesia (see MAR for exact dosages):    Anesthesia method:  Local infiltration   Local anesthetic:  Lidocaine 2% WITH epi Laceration details:    Location:  Hand   Hand location:  L hand, dorsum   Length (cm):  5.5 Repair type:    Repair type:  Intermediate Exploration:    Wound extent: no foreign bodies/material noted, no nerve damage noted, no tendon damage noted and no  underlying fracture noted   Skin repair:    Repair method:  Sutures   Suture material:  Prolene   Number of sutures: #3 deep mattress 4-0 Vicryl, #8 4-0 simple interrupted Prolene. Approximation:    Approximation:  Close Post-procedure details:    Dressing:  Antibiotic ointment   Patient tolerance of procedure:  Tolerated well, no immediate complications    (including critical care time)  Medications Ordered in ED Medications  lidocaine-EPINEPHrine (XYLOCAINE W/EPI) 2 %-1:200000 (PF) injection 20 mL (20 mLs Infiltration Given 02/25/16 0125)    Initial Impression / Assessment and Plan / ED Course  I have reviewed the triage vital signs and the nursing notes.  Pertinent labs & imaging results that were available during my care of the patient were reviewed by me and considered in my medical decision making (see  chart for details).     [name] presents to ED for laceration of left hand. On exam, LUE is NVI with full ROM. Wound explored and bottom of wound seen in a bloodless field. X-ray without foreign body or bony involvement. Laceration repaired as dictated above. Patient counseled on home wound care. Follow up with PCP/urgent care or return to ER for suture removal in 10 days. Patient was urged to return to the Emergency Department for worsening pain, swelling, expanding erythema especially if it streaks away from the affected area, fever, or for any additional concerns. Patient verbalized understanding. All questions answered.   I personally performed the services described in this documentation, which was scribed in my presence. The recorded information has been reviewed and is accurate.   Final Clinical Impressions(s) / ED Diagnoses   Final diagnoses:  Laceration of left hand without foreign body, initial encounter    New Prescriptions New Prescriptions   IBUPROFEN (ADVIL,MOTRIN) 800 MG TABLET    Take 1 tablet (800 mg total) by mouth every 8 (eight) hours as  needed.     [facility] Ward, PA-C 02/25/16 0255    Gilda Crease, MD 02/25/16 [phone]

## 2016-02-25 NOTE — Discharge Instructions (Signed)
Keep wound clean with mild soap and water. Keep area covered with a topical antibiotic ointment and bandage, keep bandage dry, and do not submerge in water for 24 hours. Ice and elevate for additional pain relief and swelling. Alternate between ibuprofen and Tylenol for additional pain relief. Follow up with your primary care doctor or the [facility] in approximately 10 days for wound recheck and suture removal. Monitor area for signs of infection to include, but not limited to: increasing pain, spreading redness, drainage/pus, worsening swelling, or fevers. Return to emergency department for emergent changing or worsening symptoms. ° ° °WOUND CARE °Keep area clean and dry for 24 hours. Do not remove bandage, if applied. °After 24 hours,you should change it at least once a day. Also, change the dressing if it becomes wet or dirty, or as directed by your caregiver.  °Wash the wound with soap and water 2 times a day. Rinse the wound off with water to remove all soap. Pat the wound dry with a clean towel.  °You may shower as usual after the first 24 hours. Do not soak the wound in water until the sutures are removed.  °Once the wound has healed, scarring can be minimized by covering the wound with sunscreen during the day for 1 full year. °Do not apply any ointments or creams to the wound while stitches/staples are in place, as this may cause delayed healing. °Return if you experience any of the following signs of infection: Swelling, redness, pus drainage, streaking, fever >101.0 F °Return if you experience excessive bleeding that does not stop after 15-20 minutes of constant, firm pressure. ° °

## 2016-02-25 NOTE — ED Notes (Signed)
Patient verbalizes understanding of discharge instructions, prescriptions, home care and follow up care. Patient out of department at this time. 

## 2016-03-09 ENCOUNTER — Encounter: Payer: Self-pay | Admitting: Internal Medicine

## 2016-03-09 ENCOUNTER — Ambulatory Visit (INDEPENDENT_AMBULATORY_CARE_PROVIDER_SITE_OTHER): Payer: Managed Care, Other (non HMO) | Admitting: Internal Medicine

## 2016-03-09 VITALS — BP 118/64 | HR 104 | Temp 98.0°F | Resp 14 | Ht 71.0 in | Wt 329.1 lb

## 2016-03-09 DIAGNOSIS — F329 Major depressive disorder, single episode, unspecified: Secondary | ICD-10-CM

## 2016-03-09 DIAGNOSIS — F418 Other specified anxiety disorders: Secondary | ICD-10-CM

## 2016-03-09 DIAGNOSIS — S61412D Laceration without foreign body of left hand, subsequent encounter: Secondary | ICD-10-CM | POA: Diagnosis not present

## 2016-03-09 DIAGNOSIS — F419 Anxiety disorder, unspecified: Secondary | ICD-10-CM

## 2016-03-09 DIAGNOSIS — F32A Depression, unspecified: Secondary | ICD-10-CM

## 2016-03-09 NOTE — Progress Notes (Signed)
Pre visit review using our clinic review tool, if applicable. No additional management support is needed unless otherwise documented below in the visit note. 

## 2016-03-09 NOTE — Progress Notes (Signed)
Subjective:    Patient ID: [name], male    DOB: 1995-12-09, 21 y.o.   MRN: [id number]  DOS:  03/09/2016 Type of visit - description :  ER follow-up Interval history:  Patient was seen at the ER 02/25/2016 with a laceration of the left hand, no self-inflicted, was an accident. X-ray showed no foreign bodies. Laceration was repaired.   Review of Systems Denies any f/c, wound with no discharge, redness or unexpected pain Also , he is now in the care of psychiatry, on lithium, feeling better emotionally.  Past Medical History:  Diagnosis Date  . Hypospadias   . Kidney stone    x4; 2 at age 21, lithotrypsy 311-2015  . Liver function test abnormality    h/o  . Major depression     [facility], [name]  is his psychiatrist-- Dx made 01-2007, started on meds then  . Obesity   . Suicidal ideations 2017   cutting behavior    Past Surgical History:  Procedure Laterality Date  . LITHOTRIPSY    . LIVER BIOPSY    . WISDOM TOOTH EXTRACTION      Social History   Social History  . Marital status: Single    Spouse name: N/A  . Number of children: 0  . Years of education: N/A   Occupational History  . to start school in 01-2015 [employer])    Social History Main Topics  . Smoking status: Never Smoker  . Smokeless tobacco: Never Used  . Alcohol use Yes     Comment: occasional/once a month  . Drug use: No  . Sexual activity: Not on file   Other Topics Concern  . Not on file   Social History Narrative   Lost GF 04-2015   Currently lives w/ his father or GM    not working but takes care og GM      Allergies as of 03/09/2016   No Known Allergies     Medication List       Accurate as of 03/09/16  5:42 PM. Always use your most recent med list.          ALPRAZolam 1 MG tablet Commonly known as:  XANAX Take 1 mg by mouth as needed.   lithium carbonate 300 MG capsule          Objective:   Physical Exam BP 118/64 (BP Location: Left Arm,  Patient Position: Sitting, Cuff Size: Normal)   Pulse (!) 104   Temp 98 F (36.7 C) (Oral)   Resp 14   Ht 5\' 11"  (1.803 m)   Wt (!) 329 lb 2 oz (149.3 kg)   SpO2 98%   BMI 45.90 kg/m  General:   Well developed, well nourished . NAD.  HEENT:  Normocephalic . Face symmetric, atraumatic Left hand: Laceration seems to be healing well, no redness or discharge  Neurologic:  alert & oriented X3.  Speech normal, gait appropriate for age and unassisted Psych--  Cognition and judgment appear intact.  Cooperative with normal attention span and concentration.  Behavior appropriate. No anxious or depressed appearing.      Assessment & Plan:    Assessment: Major depression  -- [facility], [name]  is his psychiatrist-- Dx made 01-2007, was rx meds then Morbid obesity H/o Hypospadias H/o hypogonadism, pituitary insufficiency, used to see [name] or [name] NASH: H/o increase LFTs, saw GI 2014 >> Bx +  for NASH, rx wt loss , vit e 800 u Urolithiasis --x4; 2 at age 35, lithotrypsy 01-2013  PLAN: Laceration, left hand: Multiple stitches removed w/o problems, wound seems to be healing well, recommend to call if   problems. Keep the area clean and dry Major depression: Under the care off psychiatry, on  lithium, feeling better emotionally.

## 2016-03-09 NOTE — Assessment & Plan Note (Signed)
Laceration, left hand: Multiple stitches removed w/o problems, wound seems to be healing well, recommend to call if   problems. Keep the area clean and dry Major depression: Under the care off psychiatry, on  lithium, feeling better emotionally.

## 2016-09-07 ENCOUNTER — Emergency Department (HOSPITAL_COMMUNITY)
Admission: EM | Admit: 2016-09-07 | Discharge: 2016-09-07 | Disposition: A | Discharge status: Home / Self Care | Payer: Managed Care, Other (non HMO) | Attending: Emergency Medicine | Admitting: Emergency Medicine

## 2016-09-07 ENCOUNTER — Encounter (HOSPITAL_COMMUNITY): Payer: Self-pay | Admitting: Emergency Medicine

## 2016-09-07 ENCOUNTER — Emergency Department (HOSPITAL_COMMUNITY): Payer: Managed Care, Other (non HMO)

## 2016-09-07 DIAGNOSIS — R079 Chest pain, unspecified: Secondary | ICD-10-CM | POA: Diagnosis present

## 2016-09-07 DIAGNOSIS — R0789 Other chest pain: Secondary | ICD-10-CM | POA: Insufficient documentation

## 2016-09-07 DIAGNOSIS — F419 Anxiety disorder, unspecified: Secondary | ICD-10-CM | POA: Diagnosis not present

## 2016-09-07 DIAGNOSIS — Z79899 Other long term (current) drug therapy: Secondary | ICD-10-CM | POA: Diagnosis not present

## 2016-09-07 LAB — URINALYSIS, ROUTINE W REFLEX MICROSCOPIC
Bilirubin Urine: NEGATIVE
Glucose, UA: NEGATIVE mg/dL
Ketones, ur: NEGATIVE mg/dL
Leukocytes, UA: NEGATIVE
Nitrite: NEGATIVE
Protein, ur: 30 mg/dL — AB
Specific Gravity, Urine: 1.02 (ref 1.005–1.030)
pH: 5 (ref 5.0–8.0)

## 2016-09-07 LAB — ACETAMINOPHEN LEVEL: Acetaminophen (Tylenol), Serum: <10 ug/mL — ABNORMAL LOW (ref 10–30)

## 2016-09-07 LAB — COMPREHENSIVE METABOLIC PANEL
ALT: 111 U/L — ABNORMAL HIGH (ref 17–63)
AST: 58 U/L — ABNORMAL HIGH (ref 15–41)
Albumin: 3.9 g/dL (ref 3.5–5.0)
Alkaline Phosphatase: 49 U/L (ref 38–126)
Anion gap: 9 (ref 5–15)
BUN: 7 mg/dL (ref 6–20)
CO2: 24 mmol/L (ref 22–32)
Calcium: 9.3 mg/dL (ref 8.9–10.3)
Chloride: 105 mmol/L (ref 101–111)
Creatinine, Ser: 0.87 mg/dL (ref 0.61–1.24)
GFR calc Af Amer: >60 mL/min (ref >60)
GFR calc non Af Amer: >60 mL/min (ref >60)
Glucose, Bld: 105 mg/dL — ABNORMAL HIGH (ref 65–99)
Potassium: 3.8 mmol/L (ref 3.5–5.1)
Sodium: 138 mmol/L (ref 135–145)
Total Bilirubin: 0.5 mg/dL (ref 0.3–1.2)
Total Protein: 6.9 g/dL (ref 6.5–8.1)

## 2016-09-07 LAB — CBC WITH DIFFERENTIAL/PLATELET
Basophils Absolute: 0 10*3/uL (ref 0.0–0.1)
Basophils Relative: 0 %
Eosinophils Absolute: 0.1 10*3/uL (ref 0.0–0.7)
Eosinophils Relative: 1 %
HCT: 44.2 % (ref 39.0–52.0)
Hemoglobin: 14.4 g/dL (ref 13.0–17.0)
Lymphocytes Relative: 27 %
Lymphs Abs: 2.8 10*3/uL (ref 0.7–4.0)
MCH: 28.5 pg (ref 26.0–34.0)
MCHC: 32.6 g/dL (ref 30.0–36.0)
MCV: 87.4 fL (ref 78.0–100.0)
Monocytes Absolute: 0.9 10*3/uL (ref 0.1–1.0)
Monocytes Relative: 8 %
Neutro Abs: 6.7 10*3/uL (ref 1.7–7.7)
Neutrophils Relative %: 64 %
Platelets: 296 10*3/uL (ref 150–400)
RBC: 5.06 MIL/uL (ref 4.22–5.81)
RDW: 13.3 % (ref 11.5–15.5)
WBC: 10.5 10*3/uL (ref 4.0–10.5)

## 2016-09-07 LAB — I-STAT TROPONIN, ED
Troponin i, poc: 0 ng/mL (ref 0.00–0.08)
Troponin i, poc: 0 ng/mL (ref 0.00–0.08)

## 2016-09-07 LAB — SALICYLATE LEVEL: Salicylate Lvl: <7 mg/dL (ref 2.8–30.0)

## 2016-09-07 LAB — LITHIUM LEVEL: Lithium Lvl: <0.06 mmol/L — ABNORMAL LOW (ref 0.60–1.20)

## 2016-09-07 LAB — RAPID URINE DRUG SCREEN, HOSP PERFORMED
Amphetamines: NOT DETECTED
Barbiturates: NOT DETECTED
Benzodiazepines: NOT DETECTED
Cocaine: NOT DETECTED
Opiates: NOT DETECTED
Tetrahydrocannabinol: POSITIVE — AB

## 2016-09-07 LAB — ETHANOL: Alcohol, Ethyl (B): <5 mg/dL (ref <5)

## 2016-09-07 LAB — D-DIMER, QUANTITATIVE (NOT AT ARMC): D-Dimer, Quant: <0.27 ug/mL-FEU (ref 0.00–0.50)

## 2016-09-07 MED ORDER — SODIUM CHLORIDE 0.9 % IV BOLUS (SEPSIS)
500.0000 mL | Freq: Once | INTRAVENOUS | Status: AC
  Administered 2016-09-07: 500 mL via INTRAVENOUS

## 2016-09-07 MED ORDER — IBUPROFEN 800 MG PO TABS: 800.0000 mg | ORAL_TABLET | Freq: Three times a day (TID) | ORAL | 0 refills | Status: DC | PRN

## 2016-09-07 NOTE — ED Triage Notes (Signed)
Per EMS: pt from home c/o mid sternal CP; pt sts stayed up all night; pt sts some pain to left arm; pt gvien 324mg  ASA

## 2016-09-07 NOTE — ED Notes (Signed)
Patient transported to X-ray 

## 2016-09-07 NOTE — ED Provider Notes (Signed)
[facility]-EMERG[facility]Y DEPT Provider Note   CSN: [id number] Arrival date & time: 09/07/16  0820     History   Chief Complaint Chief Complaint  Patient presents with  . Chest Pain    HPI [name] is a 21 y.o. male.  HPI Patient presents to the emergency department with chest discomfort that started at 6:30 this morning.  Patient states that certain movements make the pain worse.  The patient states that he did feel somewhat anxious. The patient denies shortness of breath, headache,blurred vision, neck pain, fever, cough, weakness, numbness, dizziness, anorexia, edema, abdominal pain, nausea, vomiting, diarrhea, rash, back pain, dysuria, hematemesis, bloody stool, near syncope, or syncope.  Patient states he did not take any medications prior to arrival Past Medical History:  Diagnosis Date  . Hypospadias   . Kidney stone    x4; 2 at age 42, lithotrypsy 45-2015  . Liver function test abnormality    h/o  . Major depression     was admited to [facility] x 1 week, [name]  is his psychiatrist-- Dx made 01-2007, started on meds then  . Obesity   . Suicidal ideations 2017   cutting behavior    Patient Active Problem List   Diagnosis Date Noted  . PCP NOTES >>> 10/20/2014  . Annual physical exam 10/19/2014  . Urolithiasis 10/27/2013  . Obesity, unspecified 07/27/2012  . RLQ abdominal pain 04/14/2012  . Pre-diabetes 06/24/2010  . BACK PAIN 05/06/2009  . TENDINITIS, LEFT HAND 10/30/2008  . HYPOGONADISM 10/04/2008  . NASH (nonalcoholic steatohepatitis) 05/23/2008  . 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 GYNECOMASTIA 03/30/2008  . ABNORMAL WEIGHT GAIN 03/30/2008  . Anxiety and depression 03/12/2008  . PERSONAL HISTORY OF HYPOSPADIAS 03/12/2008    Past Surgical History:  Procedure Laterality Date  . LITHOTRIPSY    . LIVER BIOPSY    . WISDOM TOOTH EXTRACTION         Home Medications    Prior to Admission medications   Medication Sig Start Date End Date Taking?  Authorizing Provider  ALPRAZolam Prudy Feeler) 1 MG tablet Take 1 mg by mouth 2 (two) times daily as needed for anxiety.  02/22/16  Yes [provider]  lithium carbonate 300 MG capsule Take 1,500 mg by mouth at bedtime.  02/13/16  Yes [provider]    Family History Family History  Problem Relation Age of Onset  . Breast cancer Paternal Grandmother   . Diabetes Paternal Grandmother   . Heart disease Paternal Grandmother   . Prostate cancer Paternal Grandfather   . Heart disease Paternal Grandfather   . Diabetes Maternal Grandmother   . Heart disease Father   . Heart disease Maternal Grandfather   . Colon cancer Neg Hx     Social History Social History  Substance Use Topics  . Smoking status: Never Smoker  . Smokeless tobacco: Never Used  . Alcohol use Yes     Comment: occasional/once a month     Allergies   Patient has no known allergies.   Review of Systems Review of Systems All other systems negative except as documented in the HPI. All pertinent positives and negatives as reviewed in the HPI.   Physical Exam Updated Vital Signs BP 102/73   Pulse 95   Temp 98.6 F (37 C) (Oral)   Resp 20   Ht 6' (1.829 m)   Wt 136.1 kg (300 lb)   SpO2 96%   BMI 40.69 kg/m   Physical Exam  Constitutional: He is  oriented to person, place, and time. He appears well-developed and well-nourished. No distress.  HENT:  Head: Normocephalic and atraumatic.  Mouth/Throat: Oropharynx is clear and moist.  Eyes: Pupils are equal, round, and reactive to light.  Neck: Normal range of motion. Neck supple.  Cardiovascular: Normal rate, regular rhythm and normal heart sounds.  Exam reveals no gallop and no friction rub.   No murmur heard. Pulmonary/Chest: Effort normal and breath sounds normal. No respiratory distress. He has no wheezes.  Abdominal: Soft. Bowel sounds are normal. He exhibits no distension. There is no tenderness.  Neurological: He is alert and oriented to  person, place, and time. He exhibits normal muscle tone. Coordination normal.  Skin: Skin is warm and dry. Capillary refill takes less than 2 seconds. No rash noted. No erythema.  Psychiatric: He has a normal mood and affect. His behavior is normal.  Nursing note and vitals reviewed.    ED Treatments / Results  Labs (all labs ordered are listed, but only abnormal results are displayed) Labs Reviewed  COMPREHENSIVE METABOLIC PANEL - Abnormal; Notable for the following:       Result Value   Glucose, Bld 105 (*)    AST 58 (*)    ALT 111 (*)    All other components within normal limits  LITHIUM LEVEL - Abnormal; Notable for the following:    Lithium Lvl <0.06 (*)    All other components within normal limits  URINALYSIS, ROUTINE W REFLEX MICROSCOPIC - Abnormal; Notable for the following:    APPearance HAZY (*)    Hgb urine dipstick SMALL (*)    Protein, ur 30 (*)    Bacteria, UA RARE (*)    Squamous Epithelial / LPF 0-5 (*)    All other components within normal limits  RAPID URINE DRUG SCREEN, HOSP PERFORMED - Abnormal; Notable for the following:    Tetrahydrocannabinol POSITIVE (*)    All other components within normal limits  ACETAMINOPHEN LEVEL - Abnormal; Notable for the following:    Acetaminophen (Tylenol), Serum <10 (*)    All other components within normal limits  CBC WITH DIFFERENTIAL/PLATELET  D-DIMER, QUANTITATIVE (NOT AT [facility])  ETHANOL  SALICYLATE LEVEL  I-STAT TROPONIN, ED  I-STAT TROPONIN, ED    EKG  EKG Interpretation  Date/Time:  Monday September 07 2016 08:25:07 EDT Ventricular Rate:  125 PR Interval:    QRS Duration: 100 QT Interval:  325 QTC Calculation: 469 R Axis:   101 Text Interpretation:  Sinus tachycardia Borderline right axis deviation Borderline repolarization abnormality Borderline prolonged QT interval No previous ECGs available Confirmed by Alvira Monday ([id number]) on 09/07/2016 11:09:07 AM       Radiology Dg Chest 2 View  Result Date:  09/07/2016 CLINICAL DATA:  20 year old male with midsternal chest pain. EXAM: CHEST  2 VIEW COMPARISON:  08/20/2014 FINDINGS: The heart size and mediastinal contours are within normal limits. Low lung volumes with bibasilar atelectasis. Both lungs are otherwise clear. The visualized skeletal structures are unremarkable. IMPRESSION: No active cardiopulmonary disease. Electronically Signed   By: Sande Brothers M.D.   On: 09/07/2016 10:04    Procedures Procedures (including critical care time)  Medications Ordered in ED Medications  sodium chloride 0.9 % bolus 500 mL (0 mLs Intravenous Stopped 09/07/16 1116)  sodium chloride 0.9 % bolus 500 mL (500 mLs Intravenous New Bag/Given 09/07/16 1244)     Initial Impression / Assessment and Plan / ED Course  I have reviewed the triage vital signs and the nursing  notes.  Pertinent labs & imaging results that were available during my care of the patient were reviewed by me and considered in my medical decision making (see chart for details).    Patient will be treated for atypical chest pain.  Told to follow up with his primary doctor.  Patient does not have any significant findings noted on his laboratory testing and EKG.  Patient agrees the plan.  All questions were answered  Final Clinical Impressions(s) / ED Diagnoses   Final diagnoses:  None    New Prescriptions New Prescriptions   No medications on file     Charlestine Night, Cordelia Poche 09/07/16 1411    Alvira Monday, MD 09/07/16 [phone]

## 2016-09-07 NOTE — Discharge Instructions (Signed)
Return here as needed.  Follow-up with your primary doctor. °

## 2017-04-13 ENCOUNTER — Telehealth: Payer: Self-pay

## 2017-04-13 ENCOUNTER — Ambulatory Visit: Payer: Managed Care, Other (non HMO) | Admitting: Internal Medicine

## 2017-04-13 NOTE — Telephone Encounter (Signed)
Copied from CRM [phone]. Topic: Inquiry >> Apr 13, 2017 11:02 AM Maia Pettiesrtiz, Kristie S wrote: Reason for CRM:pt mother called and states pt is feeling better and not having burning in stomach anymore, pt doesn't want to waste providers time but doesn't want $50 fee, please call mom and let her know if fee can be waived. Appt was today 1:20 with Dr. Drue NovelPaz.

## 2017-04-13 NOTE — Telephone Encounter (Signed)
Inform Pt and/or his mom that if he is feeling better- okay to cancel appt- (no charge). Pt is due for annual physical exam. Please schedule at his convenience.

## 2017-04-13 NOTE — Telephone Encounter (Signed)
Called and LVM informing him of Dr. Leta JunglingPaz's decision agreeing to cancel the appt at no charge. Also informed that it was time for his cpe and to call and schedule at his convenience.

## 2017-07-27 ENCOUNTER — Ambulatory Visit: Payer: Managed Care, Other (non HMO) | Admitting: Internal Medicine

## 2017-07-30 ENCOUNTER — Ambulatory Visit: Payer: Managed Care, Other (non HMO) | Admitting: Internal Medicine

## 2017-08-10 ENCOUNTER — Ambulatory Visit: Payer: Managed Care, Other (non HMO) | Admitting: Internal Medicine

## 2017-08-10 ENCOUNTER — Encounter: Payer: Self-pay | Admitting: Internal Medicine

## 2017-08-10 VITALS — BP 122/74 | HR 97 | Temp 98.4°F | Resp 14 | Ht 71.0 in | Wt 328.5 lb

## 2017-08-10 DIAGNOSIS — F329 Major depressive disorder, single episode, unspecified: Secondary | ICD-10-CM | POA: Diagnosis not present

## 2017-08-10 DIAGNOSIS — R1033 Periumbilical pain: Secondary | ICD-10-CM

## 2017-08-10 DIAGNOSIS — F419 Anxiety disorder, unspecified: Secondary | ICD-10-CM

## 2017-08-10 DIAGNOSIS — F32A Depression, unspecified: Secondary | ICD-10-CM

## 2017-08-10 LAB — CBC WITH DIFFERENTIAL/PLATELET
BASOS ABS: 0.1 10*3/uL (ref 0.0–0.1)
Basophils Relative: 0.9 % (ref 0.0–3.0)
EOS PCT: 1.7 % (ref 0.0–5.0)
Eosinophils Absolute: 0.1 10*3/uL (ref 0.0–0.7)
HCT: 45.2 % (ref 39.0–52.0)
HEMOGLOBIN: 15.3 g/dL (ref 13.0–17.0)
Lymphocytes Relative: 44.2 % (ref 12.0–46.0)
Lymphs Abs: 3 10*3/uL (ref 0.7–4.0)
MCHC: 33.9 g/dL (ref 30.0–36.0)
MCV: 88.7 fl (ref 78.0–100.0)
MONO ABS: 0.7 10*3/uL (ref 0.1–1.0)
MONOS PCT: 9.9 % (ref 3.0–12.0)
Neutro Abs: 2.9 10*3/uL (ref 1.4–7.7)
Neutrophils Relative %: 43.3 % (ref 43.0–77.0)
Platelets: 335 10*3/uL (ref 150.0–400.0)
RBC: 5.1 Mil/uL (ref 4.22–5.81)
RDW: 13.4 % (ref 11.5–15.5)
WBC: 6.8 10*3/uL (ref 4.0–10.5)

## 2017-08-10 LAB — URINALYSIS, ROUTINE W REFLEX MICROSCOPIC
Bilirubin Urine: NEGATIVE
KETONES UR: NEGATIVE
LEUKOCYTES UA: NEGATIVE
Nitrite: NEGATIVE
SPECIFIC GRAVITY, URINE: 1.02 (ref 1.000–1.030)
TOTAL PROTEIN, URINE-UPE24: NEGATIVE
URINE GLUCOSE: NEGATIVE
UROBILINOGEN UA: 0.2 (ref 0.0–1.0)
pH: 6 (ref 5.0–8.0)

## 2017-08-10 LAB — LIPASE: Lipase: 13 U/L (ref 11.0–59.0)

## 2017-08-10 LAB — COMPREHENSIVE METABOLIC PANEL
ALBUMIN: 4.4 g/dL (ref 3.5–5.2)
ALK PHOS: 48 U/L (ref 39–117)
ALT: 48 U/L (ref 0–53)
AST: 25 U/L (ref 0–37)
BILIRUBIN TOTAL: 0.7 mg/dL (ref 0.2–1.2)
BUN: 12 mg/dL (ref 6–23)
CALCIUM: 9.6 mg/dL (ref 8.4–10.5)
CO2: 27 mEq/L (ref 19–32)
Chloride: 103 mEq/L (ref 96–112)
Creatinine, Ser: 0.78 mg/dL (ref 0.40–1.50)
GFR: 130.63 mL/min (ref >60.00)
Glucose, Bld: 91 mg/dL (ref 70–99)
POTASSIUM: 4 meq/L (ref 3.5–5.1)
SODIUM: 137 meq/L (ref 135–145)
Total Protein: 6.9 g/dL (ref 6.0–8.3)

## 2017-08-10 LAB — AMYLASE: AMYLASE: 29 U/L (ref 27–131)

## 2017-08-10 NOTE — Patient Instructions (Signed)
GO TO THE LAB : Get the blood work     GO TO THE FRONT DESK Schedule your next appointment for a checkup in 1 month  Please call anytime if you have severe symptoms, fever, chills, blood in the stools.

## 2017-08-10 NOTE — Progress Notes (Signed)
Subjective:    Patient ID: [name], male    DOB: Aug 11, 1995, 22 y.o.   MRN: [id number]  DOS:  08/10/2017 Type of visit - description : acute  Interval history: Symptoms started 2 months ago with pain located  slightly right from the umbilicus.  Pain is on and off, initially 1 or 2 episodes a week, lately 1 or 2 episodes a day.  No radiation The pain lasts few minutes, described as sharp, not related to foods or any other known triggers.  They are described as "random". Sx is  better with occasional ibuprofen.  I noted weight loss but he reports that he is doing much better with his diet.   Review of Systems No nausea or vomiting, BMs have changed from 5 a day to 1 a day and he consider that "constipation". No fever chills, no dysuria, gross hematuria, difficulty urinating or scrotal swelling. No rash.  Past Medical History:  Diagnosis Date  . Hypospadias   . Kidney stone    x4; 2 at age 79, lithotrypsy 57-2015  . Liver function test abnormality    h/o  . Major depression     was admited to [facility] x 1 week, [name]  is his psychiatrist-- Dx made 01-2007, started on meds then  . Obesity   . Suicidal ideations 2017   cutting behavior    Past Surgical History:  Procedure Laterality Date  . LITHOTRIPSY    . LIVER BIOPSY    . WISDOM TOOTH EXTRACTION      Social History   Socioeconomic History  . Marital status: Single    Spouse name: Not on file  . Number of children: 0  . Years of education: Not on file  . Highest education level: Not on file  Occupational History  . Occupation: to start school in 01-2015 [employer])  Social Needs  . Financial resource strain: Not on file  . Food insecurity:    Worry: Not on file    Inability: Not on file  . Transportation needs:    Medical: Not on file    Non-medical: Not on file  Tobacco Use  . Smoking status: Never Smoker  . Smokeless tobacco: Never Used  Substance and Sexual Activity  . Alcohol use: Yes    Comment:  occasional/once a month  . Drug use: No  . Sexual activity: Not on file  Lifestyle  . Physical activity:    Days per week: Not on file    Minutes per session: Not on file  . Stress: Not on file  Relationships  . Social connections:    Talks on phone: Not on file    Gets together: Not on file    Attends religious service: Not on file    Active member of club or organization: Not on file    Attends meetings of clubs or organizations: Not on file    Relationship status: Not on file  . Intimate partner violence:    Fear of current or ex partner: Not on file    Emotionally abused: Not on file    Physically abused: Not on file    Forced sexual activity: Not on file  Other Topics Concern  . Not on file  Social History Narrative   Lost GF 04-2015   Currently lives w/ his father or GM    not working but takes care og GM      Allergies as of 08/10/2017   No Known Allergies  Medication List        Accurate as of 08/10/17 10:16 AM. Always use your most recent med list.          ALPRAZolam 1 MG tablet Commonly known as:  XANAX Take 1 mg by mouth 2 (two) times daily as needed for anxiety.   ibuprofen 800 MG tablet Commonly known as:  ADVIL,MOTRIN Take 1 tablet (800 mg total) by mouth every 8 (eight) hours as needed.   lithium carbonate 300 MG capsule Take 1,500 mg by mouth at bedtime.   QUEtiapine 300 MG tablet Commonly known as:  SEROQUEL Take 300 mg by mouth at bedtime.          Objective:   Physical Exam BP 122/74 (BP Location: Right Arm, Patient Position: Sitting, Cuff Size: Normal)   Pulse 97   Temp 98.4 F (36.9 C) (Oral)   Resp 14   Ht 5\' 11"  (1.803 m)   Wt (!) 328 lb 8 oz (149 kg)   SpO2 98%   BMI 45.82 kg/m  General:   Well developed, NAD, see BMI.  HEENT:  Normocephalic . Face symmetric, atraumatic Lungs:  CTA B Normal respiratory effort, no intercostal retractions, no accessory muscle use. Heart: RRR,  no murmur.  no pretibial edema  bilaterally  Abdomen:  Not distended, soft, non-tender. No rebound or rigidity.   Skin: Not pale. Not jaundice Neurologic:  alert & oriented X3.  Speech normal, gait appropriate for age and unassisted Psych--  Cognition and judgment appear intact.  Cooperative with normal attention span and concentration.  Behavior appropriate. No anxious or depressed appearing.     Assessment & Plan:     Assessment: Major depression  -- [facility], [name]  is his psychiatrist-- Dx made 01-2007, was rx meds then Morbid obesity H/o Hypospadias H/o hypogonadism, pituitary insufficiency, used to see [name] or [name] NASH: H/o increase LFTs, saw GI 2014 >> Bx + for NASH, rx wt loss , vit e 800 u Urolithiasis --x4; 2 at age [age], lithotrypsy 491-2015  PLAN: Abd. pain: As described above, symptoms going on for 2 months, ROS and physical exam does not point to any serious or severe condition. Recommend a CMP, CBC, amylase, lipase and also UA urine culture given history of urolithiasis. Otherwise observation, call if symptoms severe or different and follow-up in 1 month. Major depression: Lithium was discontinued 01-2017 d/t s/e, he continue with Seroquel, follow-up elsewhere.  He is under some stress due to his father being ill but overall states is okay. RTC 1 month

## 2017-08-10 NOTE — Progress Notes (Signed)
Pre visit review using our clinic review tool, if applicable. No additional management support is needed unless otherwise documented below in the visit note. 

## 2017-08-11 NOTE — Assessment & Plan Note (Signed)
Abd. pain: As described above, symptoms going on for 2 months, ROS and physical exam does not point to any serious or severe condition. Recommend a CMP, CBC, amylase, lipase and also UA urine culture given history of urolithiasis. Otherwise observation, call if symptoms severe or different and follow-up in 1 month. Major depression: Lithium was discontinued 01-2017 d/t s/e, he continue with Seroquel, follow-up elsewhere.  He is under some stress due to his father being ill but overall states is okay. RTC 1 month

## 2017-08-12 LAB — URINE CULTURE
MICRO NUMBER:: 90899155
SPECIMEN QUALITY:: ADEQUATE

## 2017-09-09 ENCOUNTER — Ambulatory Visit: Payer: Managed Care, Other (non HMO) | Admitting: Internal Medicine

## 2017-09-09 ENCOUNTER — Encounter: Payer: Self-pay | Admitting: Internal Medicine

## 2017-09-09 VITALS — BP 144/87 | HR 92 | Temp 98.3°F | Resp 16 | Ht 71.0 in | Wt 326.0 lb

## 2017-09-09 DIAGNOSIS — R1033 Periumbilical pain: Secondary | ICD-10-CM

## 2017-09-09 NOTE — Patient Instructions (Signed)
  GO TO THE FRONT DESK Schedule your next appointment for a  Physical, fasting in 4 months

## 2017-09-09 NOTE — Assessment & Plan Note (Signed)
Abdominal  pain: See last visit, work-up negative, symptoms resolved. Morbid obesity: He is back on a healthier diet, encourage physical activity, reassess on RTC. History of hypospadias: Does not see urology anymore. BP: Slightly elevated, recheck on RTC. Social: His father had a stroke, [name] lives with him and helps in his recovery.  He is doing better.  Encouraged to prepare a long-term plan, back to college [employer]?  Get a job?. RTC 4 months CPX.

## 2017-09-09 NOTE — Progress Notes (Signed)
Subjective:    Patient ID: [name], male    DOB: 08/11/1995, 22 y.o.   MRN: [id number]  DOS:  09/09/2017 Type of visit - description : f/u Interval history: Was last seen with abdominal pain, since then the symptoms resolved. Depression: Currently well controlled Obesity: For the last week, has started to eat healthy again.  Wt Readings from Last 3 Encounters:  09/09/17 (!) 326 lb (147.9 kg)  08/10/17 (!) 328 lb 8 oz (149 kg)  09/07/16 300 lb (136.1 kg)   Review of Systems Denies fever chills No nausea or vomiting No dysuria or gross hematuria  Past Medical History:  Diagnosis Date  . Hypospadias   . Kidney stone    x4; 2 at age 22, lithotrypsy 271-2015  . Liver function test abnormality    h/o  . Major depression     [facility], [name]  is his psychiatrist-- Dx made 01-2007, started on meds then  . Obesity   . Suicidal ideations 2017   cutting behavior    Past Surgical History:  Procedure Laterality Date  . LITHOTRIPSY    . LIVER BIOPSY    . WISDOM TOOTH EXTRACTION      Social History   Socioeconomic History  . Marital status: Single    Spouse name: Not on file  . Number of children: 0  . Years of education: Not on file  . Highest education level: Not on file  Occupational History  . Occupation: finished HS  . Occupation: not working  Engineer, productionocial Needs  . Financial resource strain: Not on file  . Food insecurity:    Worry: Not on file    Inability: Not on file  . Transportation needs:    Medical: Not on file    Non-medical: Not on file  Tobacco Use  . Smoking status: Never Smoker  . Smokeless tobacco: Never Used  Substance and Sexual Activity  . Alcohol use: Yes    Comment: occasional/once a month  . Drug use: No  . Sexual activity: Not on file  Lifestyle  . Physical activity:    Days per week: Not on file    Minutes per session: Not on file  . Stress: Not on file  Relationships  . Social connections:    Talks on  phone: Not on file    Gets together: Not on file    Attends religious service: Not on file    Active member of club or organization: Not on file    Attends meetings of clubs or organizations: Not on file    Relationship status: Not on file  . Intimate partner violence:    Fear of current or ex partner: Not on file    Emotionally abused: Not on file    Physically abused: Not on file    Forced sexual activity: Not on file  Other Topics Concern  . Not on file  Social History Narrative   Lost GF 04-2015   Currently lives w/ his father , takes care of father who had a stroke      Allergies as of 09/09/2017   No Known Allergies     Medication List        Accurate as of 09/09/17  5:40 PM. Always use your most recent med list.          ALPRAZolam 1 MG tablet Commonly known as:  XANAX Take 1 mg by mouth 2 (two) times daily as needed  for anxiety.   ibuprofen 800 MG tablet Commonly known as:  ADVIL,MOTRIN Take 1 tablet (800 mg total) by mouth every 8 (eight) hours as needed.   QUEtiapine 300 MG tablet Commonly known as:  SEROQUEL Take 300 mg by mouth at bedtime.          Objective:   Physical Exam BP (!) 144/87 (BP Location: Left Arm, Patient Position: Sitting, Cuff Size: Large)   Pulse 92   Temp 98.3 F (36.8 C) (Oral)   Resp 16   Ht 5\' 11"  (1.803 m)   Wt (!) 326 lb (147.9 kg)   SpO2 100%   BMI 45.47 kg/m  General:   Well developed, NAD, see BMI.  HEENT:  Normocephalic . Face symmetric, atraumatic  Abdomen:  Not distended, soft, non-tender. No rebound or rigidity.   Skin: Not pale. Not jaundice Neurologic:  alert & oriented X3.  Speech normal, gait appropriate for age and unassisted Psych--  Cognition and judgment appear intact.  Cooperative with normal attention span and concentration.  Behavior appropriate. No anxious or depressed appearing.     Assessment & Plan:    Assessment: Major depression  -- was admited to [facility] x 1 week, Dr. Toni Hernandez  is his  psychiatrist-- Dx made 01-2007, was rx meds then Morbid obesity H/o Hypospadias H/o hypogonadism, pituitary insufficiency, used to see Dr. Fransico Hernandez or Dr [name]; Testost wnl 2016 NASH: H/o increase LFTs, saw GI 2014 >> Bx + for NASH, rx wt loss , vit e 800 u Urolithiasis --x4; 2 at age 4, lithotrypsy 01-2013  PLAN: Abdominal  pain: See last visit, work-up negative, symptoms resolved. Morbid obesity: He is back on a healthier diet, encourage physical activity, reassess on RTC. History of hypospadias: Does not see urology anymore. BP: Slightly elevated, recheck on RTC. Social: His father had a stroke, [name] lives with him and helps in his recovery.  He is doing better.  Encouraged to prepare a long-term plan, back to college [employer]?  Get a job?. RTC 4 months CPX.

## 2017-11-02 ENCOUNTER — Ambulatory Visit (INDEPENDENT_AMBULATORY_CARE_PROVIDER_SITE_OTHER): Payer: 59 | Admitting: Mental Health

## 2017-11-02 DIAGNOSIS — F3132 Bipolar disorder, current episode depressed, moderate: Secondary | ICD-10-CM

## 2017-11-02 NOTE — Progress Notes (Signed)
      [facility] Counselor/Therapist Progress Note   Patient ID: [name], MRN: [id number]  Date: 11/02/2017  Timespent: 45 minutes  Treatment Type: Individual  Subjective: Single young man lives with his father. Problem with procrastination. No future plans at this point. Spends a lot of time alone. Blended family.  Interventions:CBT and Supportive  Mental Status Exam:   Appearance:    casual, obese    Behavior:  Appropriate  Motor:  Normal  Speech/Language:   Clear and Coherent  Affect:  Appropriate  Mood:  euthymic  Thought process:  Intact  Thought content:    Logical  Perceptual disturbances:    Normal  Orientation:  Full (Time, Place, and Person)  Attention:  Good  Concentration:  good  Memory:  Immediate  Fund of knowledge:   Good  Insight:    Good  Judgment:   Intact  Impulse Control:  good    Reported Symptoms: depression, anxiety, excessive isolation, procrastination, low leel of interest in most pursuits.  Risk Assessment: Danger to Self:  No Self-injurious Behavior: No Danger to Others: No Duty to Warn:no Physical Aggression / Violence:No  Access to Firearms a concern: No  Gang Involvement:No   Diagnosis:   ICD-10-CM   1. Moderate depressed bipolar I disorder (HCC) F31.32      Plan:  Explore opportunities at [facility]            Self care program including diet, exercise and restful sleep             Discuss purchase of dog with father            Return to office in two weeks   Ulice Bold, [facility]

## 2017-12-03 ENCOUNTER — Ambulatory Visit (INDEPENDENT_AMBULATORY_CARE_PROVIDER_SITE_OTHER): Payer: 59 | Admitting: Mental Health

## 2017-12-03 ENCOUNTER — Encounter: Payer: Self-pay | Admitting: Mental Health

## 2017-12-03 DIAGNOSIS — F3132 Bipolar disorder, current episode depressed, moderate: Secondary | ICD-10-CM | POA: Diagnosis not present

## 2017-12-03 NOTE — Progress Notes (Signed)
      [facility] Counselor/Therapist Progress Note  Patient ID: Celesta AverBrandon M Dearing, MRN: [id number],    Date: 12/03/2017  Time Spent: 45 minutes  Treatment Type: Individual Therapy  Reported Symptoms: Anxiety when he goes to bed at night. Isolated. Obese. Some despondency due to loss of family members and approaching holidays.  Mental Status Exam:  Appearance:   Casual     Behavior:  Appropriate  Motor:  Normal  Speech/Language:   Clear and Coherent  Affect:  Appropriate  Mood:  euthymic  Thought process:  normal  Thought content:    WNL  Sensory/Perceptual disturbances:    WNL  Orientation:  oriented to person, place and time/date  Attention:  Good  Concentration:  Good  Memory:  WNL  Fund of knowledge:   Good  Insight:    Good  Judgment:   Good  Impulse Control:  Good   Risk Assessment: Danger to Self:  No Self-injurious Behavior: No Danger to Others: No Duty to Warn:no  Physical Aggression / Violence:No  Access to Firearms a concern: No  Gang Involvement:No   Subjective:  Trying to pick up the pace to get his life on track. [facility]. Spends too much time alone. Has continued walking program and also starts dinner preparations for father. Would like to have a dog to take on walks. Conversant and polite and cooperative. Recommended bibiotherapy.  Interventions: Cognitive Behavioral Therapy, Solution-Oriented/Positive Psychology, Interpersonal and supportive  Diagnosis:   ICD-10-CM   1. 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

## 2018-01-10 ENCOUNTER — Ambulatory Visit (INDEPENDENT_AMBULATORY_CARE_PROVIDER_SITE_OTHER): Payer: Managed Care, Other (non HMO) | Admitting: Internal Medicine

## 2018-01-10 ENCOUNTER — Encounter: Payer: Self-pay | Admitting: Internal Medicine

## 2018-01-10 VITALS — BP 128/68 | HR 68 | Temp 98.3°F | Resp 16 | Ht 71.0 in | Wt 322.5 lb

## 2018-01-10 DIAGNOSIS — Z789 Other specified health status: Secondary | ICD-10-CM

## 2018-01-10 DIAGNOSIS — Z0000 Encounter for general adult medical examination without abnormal findings: Secondary | ICD-10-CM | POA: Diagnosis not present

## 2018-01-10 DIAGNOSIS — R739 Hyperglycemia, unspecified: Secondary | ICD-10-CM | POA: Diagnosis not present

## 2018-01-10 DIAGNOSIS — E291 Testicular hypofunction: Secondary | ICD-10-CM

## 2018-01-10 DIAGNOSIS — Z23 Encounter for immunization: Secondary | ICD-10-CM

## 2018-01-10 LAB — COMPREHENSIVE METABOLIC PANEL
ALK PHOS: 50 U/L (ref 39–117)
ALT: 51 U/L (ref 0–53)
AST: 22 U/L (ref 0–37)
Albumin: 4.4 g/dL (ref 3.5–5.2)
BILIRUBIN TOTAL: 0.5 mg/dL (ref 0.2–1.2)
BUN: 9 mg/dL (ref 6–23)
CALCIUM: 9.5 mg/dL (ref 8.4–10.5)
CO2: 28 mEq/L (ref 19–32)
CREATININE: 0.93 mg/dL (ref 0.40–1.50)
Chloride: 101 mEq/L (ref 96–112)
GFR: 106.25 mL/min (ref >60.00)
Glucose, Bld: 82 mg/dL (ref 70–99)
Potassium: 3.8 mEq/L (ref 3.5–5.1)
Sodium: 139 mEq/L (ref 135–145)
TOTAL PROTEIN: 6.9 g/dL (ref 6.0–8.3)

## 2018-01-10 LAB — LIPID PANEL
Cholesterol: 182 mg/dL (ref 0–200)
HDL: 50.6 mg/dL (ref >39.00)
LDL Cholesterol: 116 mg/dL — ABNORMAL HIGH (ref 0–99)
NONHDL: 131.04
Total CHOL/HDL Ratio: 4
Triglycerides: 73 mg/dL (ref 0.0–149.0)
VLDL: 14.6 mg/dL (ref 0.0–40.0)

## 2018-01-10 LAB — TSH: TSH: 1.03 u[IU]/mL (ref 0.35–4.50)

## 2018-01-10 LAB — HEMOGLOBIN A1C: Hgb A1c MFr Bld: 5.4 % (ref 4.6–6.5)

## 2018-01-10 NOTE — Progress Notes (Signed)
Pre visit review using our clinic review tool, if applicable. No additional management support is needed unless otherwise documented below in the visit note. 

## 2018-01-10 NOTE — Assessment & Plan Note (Signed)
Td 2017 Polio completed  HPV: completed   Meningitis shot age 22 (completed) Bexero #1 today, RTC 1 month. Check on varicella titer Labs: CMP, FLP, TSH, A1c, varicella titer,  free and total testosterone Diet and exercise: Has definitely improved his diet, praised.  Plans to start to exercise, recommend a gradual program. Safe sex, STE discussed

## 2018-01-10 NOTE — Patient Instructions (Signed)
GO TO THE LAB : Get the blood work    GO TO THE FRONT DESK Schedule a nurse visit for your second shot of Bexsero 4 weeks from today  Schedule your next appointment for a physical exam in 1 year     Testicular Self-Exam A self-examination of your testicles (testicular self-exam) involves looking at and feeling your testicles for abnormal lumps or swelling. Several things can cause swelling, lumps, or pain in your testicles. Some of these causes are:  Injuries.  Inflammation.  Infection.  Buildup of fluids around your testicle (hydrocele).  Twisted testicles (testicular torsion).  Testicular cancer. Why is it important to do a testicular self-exam? Self-examination of the testicles and the left and right groin areas may be recommended if you are at risk for testicular cancer. Your groin is where your lower abdomen meets your upper thighs. You may be at risk for testicular cancer if you have:  An undescended testicle (cryptorchidism).  A history of previous testicular cancer.  A family history of testicular cancer. How to do a testicular self-exam The testicles are easiest to examine after a warm bath or shower. They are more difficult to examine when you are cold. This is because the muscles attached to the testicles retract and pull them up higher or into the abdomen. A normal testicle is egg-shaped and feels firm. It is smooth and not tender. The spermatic cord can be felt as a firm, spaghetti-like cord at the back of your testicle. Look and feel for changes  Stand and hold your penis away from your body.  Look at each testicle to check for lumps or swelling.  Roll each testicle between your thumb and forefinger, feeling the entire testicle. Feel for: ? Lumps. ? Swelling. ? Discomfort.  Check the groin area between your abdomen and upper thighs on both sides of your body. Look and feel for any swelling or bumps that are tender. These could be enlarged lymph  nodes. Contact a health care provider if:  You find any bumps or lumps, such as a small, hard, pea-sized lump.  You find swelling, pain, or soreness.  You see or feel any other changes in your testicles. Summary  A self-examination of your testicles (testicular self-exam) involves looking at and feeling your testicles for any changes.  Self-examination of the testicles and the left and right groin areas may be recommended if you are at risk for testicular cancer.  You should check each of your testicles for lumps, swelling, or discomfort.  You should check for swelling or tender bumps in your groin area between your lower abdomen and upper thighs. This information is not intended to replace advice given to you by your health care provider. Make sure you discuss any questions you have with your health care provider. Document Released: 04/06/2000 Document Revised: 11/25/2015 Document Reviewed: 11/25/2015 Elsevier Interactive Patient Education  2019 [employer]   Safe Sex Practicing safe sex means taking steps before and during sex to reduce your risk of:  Getting an STD (sexually transmitted disease).  Giving your partner an STD.  Unwanted pregnancy. How can I practice safe sex? To practice safe sex:  Limit your sexual partners to only one partner who is having sex with only you.  Avoid using alcohol and recreational drugs before having sex. These substances can affect your judgment.  Before having sex with a new partner: ? Talk to your partner about past partners, past STDs, and drug use. ? You and your partner  should be screened for STDs and discuss the results with each other.  Check your body regularly for sores, blisters, rashes, or unusual discharge. If you notice any of these problems, visit your health care provider.  If you have symptoms of an infection or you are being treated for an STD, avoid sexual contact.  While having sex, use a condom. Make sure  to: ? Use a condom every time you have vaginal, oral, or anal sex. Both females and males should wear condoms during oral sex. ? Keep condoms in place from the beginning to the end of sexual activity. ? Use a latex condom, if possible. Latex condoms offer the best protection. ? Use only water-based lubricants or oils to lubricate a condom. Using petroleum-based lubricants or oils will weaken the condom and increase the chance that it will break.  See your health care provider for regular screenings, exams, and tests for STDs.  Talk with your health care provider about the form of birth control (contraception) that is best for you.  Get vaccinated against hepatitis B and human papillomavirus (HPV).  If you are at risk of being infected with HIV (human immunodeficiency virus), talk with your health care provider about taking a prescription medicine to prevent HIV infection. You are considered at risk for HIV if: ? You are a man who has sex with other men. ? You are a heterosexual man or woman who is sexually active with more than one partner. ? You take drugs by injection. ? You are sexually active with a partner who has HIV. This information is not intended to replace advice given to you by your health care provider. Make sure you discuss any questions you have with your health care provider. Document Released: 02/06/2004 Document Revised: 05/15/2015 Document Reviewed: 11/18/2014 Elsevier Interactive Patient Education  2019 [employer]

## 2018-01-10 NOTE — Assessment & Plan Note (Signed)
Major depression: Sees psychiatry, controlled Morbid obesity: Doing better. GU: No recent symptoms or issues H/o hypogonadism: Checking labs History of NASH Checking labs Social: Lives at home, takes care of his father who had a stroke, [facility] RTC 1 [facility]

## 2018-01-10 NOTE — Progress Notes (Signed)
Subjective:    Patient ID: [name], male    DOB: 1995/04/06, 22 y.o.   MRN: [id number]  DOS:  01/10/2018 Type of visit - description: cpx Feels well, no major concerns  Wt Readings from Last 3 Encounters:  01/10/18 (!) 322 lb 8 oz (146.3 kg)  09/09/17 (!) 326 lb (147.9 kg)  08/10/17 (!) 328 lb 8 oz (149 kg)   BP Readings from Last 3 Encounters:  01/10/18 128/68  09/09/17 (!) 144/87  08/10/17 122/74     Review of Systems  Other than above, a 14 point review of systems is negative     Past Medical History:  Diagnosis Date  . Hypospadias   . 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 Liver function test abnormality    h/o  . Major depression     [facility], [name]  is his psychiatrist-- Dx made 01-2007, started on meds then  . Obesity   . Suicidal ideations 2017   cutting behavior    Past Surgical History:  Procedure Laterality Date  . LITHOTRIPSY    . LIVER BIOPSY    . WISDOM TOOTH EXTRACTION      Social History   Socioeconomic History  . Marital status: Single    Spouse name: Not on file  . Number of children: 0  . Years of education: Not on file  . Highest education level: Not on file  Occupational History  . Occupation: finished HS  . Occupation: not working  Engineer, productionocial Needs  . Financial resource strain: Not on file  . Food insecurity:    Worry: Not on file    Inability: Not on file  . Transportation needs:    Medical: Not on file    Non-medical: Not on file  Tobacco Use  . Smoking status: Never Smoker  . Smokeless tobacco: Never Used  Substance and Sexual Activity  . Alcohol use: Yes    Comment: occasional/once a month  . Drug use: No  . Sexual activity: Not on file  Lifestyle  . Physical activity:    Days per week: Not on file    Minutes per session: Not on file  . Stress: Not on file  Relationships  . Social connections:    Talks on phone: Not on file    Gets together: Not on file   Attends religious service: Not on file    Active member of club or organization: Not on file    Attends meetings of clubs or organizations: Not on file    Relationship status: Not on file  . Intimate partner violence:    Fear of current or ex partner: Not on file    Emotionally abused: Not on file    Physically abused: Not on file    Forced sexual activity: Not on file  Other Topics Concern  . Not on file  Social History Narrative   Lost GF 04-2015   Currently lives w/ his father, takes care of father who had a stroke     Family History  Problem Relation Age of Onset  . Breast cancer Paternal Grandmother   . Diabetes Paternal Grandmother   . Heart disease Paternal Grandmother   . Prostate cancer Paternal Grandfather   . Heart disease Paternal Grandfather   . Diabetes Maternal Grandmother   . Heart disease Father   . Heart disease Maternal Grandfather   . Colon cancer Neg Hx  Allergies as of 01/10/2018   No Known Allergies     Medication List       Accurate as of January 10, 2018  5:41 PM. Always use your most recent med list.        ALPRAZolam 1 MG tablet Commonly known as:  XANAX Take 1 mg by mouth 2 (two) times daily as needed for anxiety.   ibuprofen 800 MG tablet Commonly known as:  ADVIL,MOTRIN Take 1 tablet (800 mg total) by mouth every 8 (eight) hours as needed.   QUEtiapine 300 MG tablet Commonly known as:  SEROQUEL Take 300 mg by mouth at bedtime.           Objective:   Physical Exam BP 128/68 (BP Location: Left Arm, Patient Position: Sitting, Cuff Size: Normal)   Pulse 68   Temp 98.3 F (36.8 C) (Oral)   Resp 16   Ht 5\' 11"  (1.803 m)   Wt (!) 322 lb 8 oz (146.3 kg)   SpO2 98%   BMI 44.98 kg/m  General: Well developed, NAD, BMI noted Neck: No  thyromegaly  HEENT:  Normocephalic . Face symmetric, atraumatic Lungs:  CTA B Normal respiratory effort, no intercostal retractions, no accessory muscle use. Heart: RRR,  no murmur.  No  pretibial edema bilaterally  Abdomen:  Not distended, soft, non-tender. No rebound or rigidity.   Skin: Exposed areas without rash. Not pale. Not jaundice Neurologic:  alert & oriented X3.  Speech normal, gait appropriate for age and unassisted Strength symmetric and appropriate for age.  Psych: Cognition and judgment appear intact.  Cooperative with normal attention span and concentration.  Behavior appropriate. No anxious or depressed appearing.     Assessment      Assessment: Major depression  -- [facility],  sees psychiatriy-- Dx made 01-2007, was rx meds then Morbid obesity H/o Hypospadias H/o hypogonadism, pituitary insufficiency, used to see [name] or [name]; Testost wnl 2016 NASH: H/o increase LFTs, saw GI 2014 >> Bx + for NASH, rx wt loss , vit e 800 u Urolithiasis --x4; 2 at age 22, lithotrypsy 01-2013  PLAN: Major depression: Sees psychiatry, controlled Morbid obesity: Doing better. GU: No recent symptoms or issues H/o hypogonadism: Checking labs History of NASH Checking labs Social: Lives at home, takes care of his father who had a stroke, [facility] RTC 1 year

## 2018-01-11 LAB — VARICELLA ZOSTER ANTIBODY, IGG: Varicella IgG: 3594 index

## 2018-01-11 LAB — SPECIMEN STATUS REPORT

## 2018-01-11 LAB — TESTOSTERONE,FREE AND TOTAL
TESTOSTERONE FREE: 14.4 pg/mL (ref 9.3–26.5)
TESTOSTERONE: 416 ng/dL (ref 264–916)

## 2018-01-17 ENCOUNTER — Encounter: Payer: Self-pay | Admitting: Mental Health

## 2018-01-17 ENCOUNTER — Ambulatory Visit (INDEPENDENT_AMBULATORY_CARE_PROVIDER_SITE_OTHER): Payer: No Typology Code available for payment source | Admitting: Mental Health

## 2018-01-17 DIAGNOSIS — F3132 Bipolar disorder, current episode depressed, moderate: Secondary | ICD-10-CM | POA: Diagnosis not present

## 2018-01-17 NOTE — Progress Notes (Signed)
      [facility] Counselor/Therapist Progress Note  Patient ID: JAMALL NIVISON, MRN: [id number],    Date: 01/17/2018  Time Spent: 45 minutes  Treatment Type: Individual Therapy  Reported Symptoms: Anxious Mood and Isolation and withdrawal. Avoidant.  Mental Status Exam:  Appearance:   Casual     Behavior:  Appropriate  Motor:  Normal  Speech/Language:   Clear and Coherent  Affect:  Negative  Mood:  normal  Thought process:  negative  Thought content:    WNL  Sensory/Perceptual disturbances:    WNL  Orientation:  oriented to person, place and time/date  Attention:  Good  Concentration:  Good  Memory:  WNL  Fund of knowledge:   Good  Insight:    Fair  Judgment:   Good  Impulse Control:  Good   Risk Assessment: Danger to Self:  No Self-injurious Behavior: No Danger to Others: No Duty to Warn:no Physical Aggression / Violence:No  Access to Firearms a concern: No  Gang Involvement:No   Subjective: Anxious, Isolated. Reluctant to venture out. Still maintains walking program. Encouraged to pursue investigating photography program at [facility].Marland Kitchen Toxic and limiting belief that people don't like him with no supporting evidence. Sleeps well  .    Interventions: Cognitive Behavioral Therapy, Solution-Oriented/Positive Psychology and Interpersonal  Diagnosis:   ICD-10-CM   1. 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

## 2018-01-24 ENCOUNTER — Encounter: Payer: Self-pay | Admitting: Emergency Medicine

## 2018-01-24 DIAGNOSIS — F3132 Bipolar disorder, current episode depressed, moderate: Secondary | ICD-10-CM | POA: Insufficient documentation

## 2018-01-24 DIAGNOSIS — F411 Generalized anxiety disorder: Secondary | ICD-10-CM | POA: Insufficient documentation

## 2018-02-08 ENCOUNTER — Ambulatory Visit (INDEPENDENT_AMBULATORY_CARE_PROVIDER_SITE_OTHER): Payer: PRIVATE HEALTH INSURANCE | Admitting: *Deleted

## 2018-02-08 DIAGNOSIS — Z23 Encounter for immunization: Secondary | ICD-10-CM

## 2018-02-08 NOTE — Progress Notes (Signed)
Patient here for 2nd Bexsero injection.  Injection given and tolerated well.

## 2018-02-15 ENCOUNTER — Ambulatory Visit (INDEPENDENT_AMBULATORY_CARE_PROVIDER_SITE_OTHER): Payer: No Typology Code available for payment source | Admitting: Mental Health

## 2018-02-15 ENCOUNTER — Encounter: Payer: Self-pay | Admitting: Mental Health

## 2018-02-15 DIAGNOSIS — F3132 Bipolar disorder, current episode depressed, moderate: Secondary | ICD-10-CM | POA: Diagnosis not present

## 2018-02-15 NOTE — Progress Notes (Signed)
      [facility] Counselor/Therapist Progress Note  Patient ID: [name], MRN: [id number],    Date: 02/15/2018  Time Spent: 45 minutes  Treatment Type: Individual Therapy  Reported Symptoms: Anxious Mood, Sleep disturbance and Fatigue  Mental Status Exam:  Appearance:   Casual     Behavior:  Appropriate and Sharing  Motor:  slow  Speech/Language:   Normal Rate  Affect:  Appropriate  Mood:  avoidant  Thought process:  normal  Thought content:    WNL  Sensory/Perceptual disturbances:    WNL  Orientation:  oriented to person, place and time/date  Attention:  Good  Concentration:  Good  Memory:  WNL  Fund of knowledge:   Good  Insight:    Fair  Judgment:   Fair  Impulse Control:  Good   Risk Assessment: Danger to Self:  No Self-injurious Behavior: No Danger to Others: No Duty to Warn:no Physical Aggression / Violence:No  Access to Firearms a concern: No  Gang Involvement:No   Subjective: Gets depressed and anxious primarily at night because he feels stuck and cannot get himself moving. Encouraged to check out the [facility] photography program. Avoidant. Discussed option of therapy pet.   Interventions: Cognitive Behavioral Therapy, Solution-Oriented/Positive Psychology, Insight-Oriented and Interpersonal  Diagnosis:   ICD-10-CM   1. 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

## 2018-02-17 ENCOUNTER — Ambulatory Visit: Payer: No Typology Code available for payment source | Admitting: Mental Health

## 2018-03-08 ENCOUNTER — Ambulatory Visit (INDEPENDENT_AMBULATORY_CARE_PROVIDER_SITE_OTHER): Payer: No Typology Code available for payment source | Admitting: Physician Assistant

## 2018-03-08 ENCOUNTER — Encounter: Payer: Self-pay | Admitting: Physician Assistant

## 2018-03-08 DIAGNOSIS — F3132 Bipolar disorder, current episode depressed, moderate: Secondary | ICD-10-CM

## 2018-03-08 DIAGNOSIS — F411 Generalized anxiety disorder: Secondary | ICD-10-CM | POA: Diagnosis not present

## 2018-03-08 MED ORDER — QUETIAPINE FUMARATE 300 MG PO TABS: 300.0000 mg | ORAL_TABLET | Freq: Every day | ORAL | 1 refills | Status: DC

## 2018-03-08 MED ORDER — ALPRAZOLAM 1 MG PO TABS: 0.5000 mg | ORAL_TABLET | Freq: Two times a day (BID) | ORAL | 5 refills | Status: DC | PRN

## 2018-03-08 NOTE — Progress Notes (Signed)
[facility] Med Check  Patient ID: [name],  MRN: [id number]  PCP: Wanda Plump, MD  Date of Evaluation: 03/08/2018 Time spent:15 minutes  Chief Complaint:  Chief Complaint    Follow-up      HISTORY/CURRENT STATUS: HPI here for 41-month med check.  States he is doing well overall.  A couple of nights of week, he will have a little more anxiety when he is laying in bed trying to go to sleep.  He has some racing thoughts and is unable to fall asleep quickly.  He will occasionally take Xanax for those times which do help.  It does make him tired the next day however.  He feels a little sad sometimes when he thinks about his family or friends who have died but the sadness goes away within a day or 2.  He is currently not working but hoping to find a job soon.  He is still eating well and losing weight gradually.  Patient denies loss of interest in usual activities and is able to enjoy things.  Denies decreased energy or motivation.  Appetite has not changed.  No extreme sadness, tearfulness, or feelings of hopelessness.  Denies any changes in concentration, making decisions or remembering things.  Denies suicidal or homicidal thoughts.  Patient denies increased energy with decreased need for sleep, no increased talkativeness, no racing thoughts, no impulsivity or risky behaviors, no increased spending, no increased libido, no grandiosity.  Denies muscle or joint pain, stiffness, or dystonia.  Denies dizziness, syncope, seizures, numbness, tingling, tremor, tics, unsteady gait, slurred speech, confusion.   Individual Medical History/ Review of Systems: Changes? :No    Past medications for mental health diagnoses include: Lithium, BuSpar, Xanax, Seroquel, Risperdal, Depakote, Zoloft  Allergies: Patient has no known allergies.  Current Medications:  Current Outpatient Medications:  .  ALPRAZolam (XANAX) 1 MG tablet, Take 0.5-1 tablets (0.5-1 mg total) by mouth 2 (two)  times daily as needed for anxiety., Disp: 30 tablet, Rfl: 5 .  QUEtiapine (SEROQUEL) 300 MG tablet, Take 1 tablet (300 mg total) by mouth at bedtime., Disp: 90 tablet, Rfl: 1 .  ibuprofen (ADVIL,MOTRIN) 800 MG tablet, Take 1 tablet (800 mg total) by mouth every 8 (eight) hours as needed. (Patient not taking: Reported on 03/08/2018), Disp: 21 tablet, Rfl: 0 Medication Side Effects: none  Family Medical/ Social History: Changes? No  MENTAL HEALTH EXAM:  There were no vitals taken for this visit.There is no height or weight on file to calculate BMI.  General Appearance: Casual, Well Groomed and Obese  Eye Contact:  Good  Speech:  Clear and Coherent  Volume:  Normal  Mood:  Euthymic  Affect:  Appropriate  Thought Process:  Goal Directed  Orientation:  Full (Time, Place, and Person)  Thought Content: Logical   Suicidal Thoughts:  No  Homicidal Thoughts:  No  Memory:  WNL  Judgement:  Good  Insight:  Good  Psychomotor Activity:  Normal  Concentration:  Concentration: Good  Recall:  Good  Fund of Knowledge: Good  Language: Good  Assets:  Desire for Improvement  ADL's:  Intact  Cognition: WNL  Prognosis:  Good  Labs at PCP 01/10/2018 glucose is 82 and all other values of CMP are normal.  Lipid profile is normal except LDL is 116.  DIAGNOSES:    ICD-10-CM   1. 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 Generalized anxiety disorder F41.1     Receiving Psychotherapy: Yes With Ulice Bold, [facility]  RECOMMENDATIONS: I am glad to see him doing so well! Continue Seroquel 300 mg p.o. nightly. Continue Xanax 1 mg, 1/2-1 twice daily as needed.  Recommended he try half pill which hopefully will help with the racing thoughts at night and not cause so much drowsiness the next day. Continue healthy eating, walking or other exercise.  He is doing great with the weight loss! Continue psychotherapy with Ulice Bold, LPC. Return in 6 months or sooner as needed.    [name],  PA-C

## 2018-03-16 ENCOUNTER — Encounter: Payer: Self-pay | Admitting: Mental Health

## 2018-03-16 ENCOUNTER — Ambulatory Visit (INDEPENDENT_AMBULATORY_CARE_PROVIDER_SITE_OTHER): Payer: No Typology Code available for payment source | Admitting: Mental Health

## 2018-03-16 DIAGNOSIS — F3132 Bipolar disorder, current episode depressed, moderate: Secondary | ICD-10-CM

## 2018-03-16 NOTE — Progress Notes (Signed)
      [facility] Counselor/Therapist Progress Note  Patient ID: [name], MRN: [id number],    Date: 03/16/2018  Time Spent: 45 minutes  Treatment Type: Individual Therapy  Reported Symptoms: Mood has been stable. Isolated and avoidant. Has some anxiety and fidgiting at night  Mental Status Exam:  Appearance:   Casual     Behavior:  Appropriate and Sharing  Motor:  Normal  Speech/Language:   Normal Rate  Affect:  Appropriate  Mood:  anxious  Thought process:  normal  Thought content:    WNL  Sensory/Perceptual disturbances:    WNL  Orientation:  oriented to person, place and time/date  Attention:  Good  Concentration:  Good  Memory:  WNL  Fund of knowledge:   Good  Insight:    Good  Judgment:   Good  Impulse Control:  Good   Risk Assessment: Danger to Self:  No Self-injurious Behavior: No Danger to Others: No Duty to Warn:no Physical Aggression / Violence:No  Access to Firearms a concern: No  Gang Involvement:No   Subjective:    Interventions: Cognitive Behavioral Therapy, Solution-Oriented/Positive Psychology, Insight-Oriented and Interpersonal  Diagnosis:   ICD-10-CM   1. 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

## 2018-04-13 ENCOUNTER — Ambulatory Visit: Payer: No Typology Code available for payment source | Admitting: Mental Health

## 2018-09-05 ENCOUNTER — Other Ambulatory Visit: Payer: Self-pay | Admitting: Physician Assistant

## 2018-09-06 ENCOUNTER — Ambulatory Visit: Payer: No Typology Code available for payment source | Admitting: Physician Assistant

## 2018-09-06 ENCOUNTER — Other Ambulatory Visit: Payer: Self-pay

## 2018-09-06 ENCOUNTER — Telehealth: Payer: Self-pay | Admitting: Physician Assistant

## 2018-09-06 NOTE — Telephone Encounter (Signed)
Pended for approval last fill 08/12/2018

## 2018-09-06 NOTE — Telephone Encounter (Signed)
Pt request refill for Xanax. Had appt today provider sick. We rescheduled. [facility] on file

## 2018-09-07 MED ORDER — ALPRAZOLAM 1 MG PO TABS: 0.5000 mg | ORAL_TABLET | Freq: Two times a day (BID) | ORAL | 5 refills | Status: DC | PRN

## 2018-09-27 ENCOUNTER — Encounter: Payer: Self-pay | Admitting: Physician Assistant

## 2018-09-27 ENCOUNTER — Ambulatory Visit (INDEPENDENT_AMBULATORY_CARE_PROVIDER_SITE_OTHER): Payer: No Typology Code available for payment source | Admitting: Physician Assistant

## 2018-09-27 ENCOUNTER — Other Ambulatory Visit: Payer: Self-pay

## 2018-09-27 DIAGNOSIS — F411 Generalized anxiety disorder: Secondary | ICD-10-CM

## 2018-09-27 DIAGNOSIS — Z79899 Other long term (current) drug therapy: Secondary | ICD-10-CM | POA: Diagnosis not present

## 2018-09-27 DIAGNOSIS — F3132 Bipolar disorder, current episode depressed, moderate: Secondary | ICD-10-CM

## 2018-09-27 MED ORDER — LITHIUM CARBONATE 300 MG PO TABS: ORAL_TABLET | ORAL | 1 refills | Status: DC

## 2018-09-27 MED ORDER — QUETIAPINE FUMARATE 100 MG PO TABS: ORAL_TABLET | ORAL | 0 refills | Status: DC

## 2018-09-27 NOTE — Progress Notes (Signed)
[facility]  Patient ID: [name],  MRN: [id number]  PCP: Colon Branch, MD  Date of Evaluation: 09/27/2018 Time spent:15 minutes  Chief Complaint:  Chief Complaint    Follow-up      HISTORY/CURRENT STATUS: HPI for routine med check.  Patient would like to go back on the lithium because he felt that it helped his mood be more stable.  Also the Seroquel does make him hungrier, especially in the evening.  He is still doing a great job with weight loss and has lost another almost 50 pounds in the past year.  He does not want the Seroquel to prevent further weight loss however.  Also the Seroquel makes him more tired during the day.  When he was on the lithium, the only complaint was some mild nausea but he states it was not that bad and he would like to retry it.  Patient denies loss of interest in usual activities and is able to enjoy things.  Denies decreased energy or motivation.  Appetite has not changed.  No extreme sadness, tearfulness, or feelings of hopelessness.  Denies any changes in concentration, making decisions or remembering things.  Denies suicidal or homicidal thoughts.  Patient denies increased energy with decreased need for sleep, no increased talkativeness, no racing thoughts, no impulsivity or risky behaviors, no increased spending, no increased libido, no grandiosity.  Anxiety is well controlled.  The Xanax is helpful.  He sleeps well.  Denies dizziness, syncope, seizures, numbness, tingling, tremor, tics, unsteady gait, slurred speech, confusion. Denies muscle or joint pain, stiffness, or dystonia.  Individual Medical History/ Review of Systems: Changes? :No    Past medications for mental health diagnoses include: Lithium, BuSpar, Xanax, Seroquel, Risperdal, Depakote, Zoloft  Allergies: Patient has no known allergies.  Current Medications:  Current Outpatient Medications:  .  ALPRAZolam (XANAX) 1 MG tablet, Take 0.5-1 tablets (0.5-1 mg  total) by mouth 2 (two) times daily as needed for anxiety., Disp: 30 tablet, Rfl: 5 .  ibuprofen (ADVIL,MOTRIN) 800 MG tablet, Take 1 tablet (800 mg total) by mouth every 8 (eight) hours as needed., Disp: 21 tablet, Rfl: 0 .  lithium 300 MG tablet, 1 po qhs for 3 nights, then 2 qhs., Disp: 60 tablet, Rfl: 1 .  QUEtiapine (SEROQUEL) 100 MG tablet, Take as directed to wean off. (He has instructions), Disp: 34 tablet, Rfl: 0 Medication Side Effects: hypersomnolence and increased appetite  Family Medical/ Social History: Changes? No  MENTAL HEALTH EXAM:  There were no vitals taken for this visit.There is no height or weight on file to calculate BMI.  General Appearance: Casual and Obese he has lost weight since LOV  Eye Contact:  Good  Speech:  Clear and Coherent  Volume:  Normal  Mood:  Euthymic  Affect:  Appropriate  Thought Process:  Goal Directed  Orientation:  Full (Time, Place, and Person)  Thought Content: Logical   Suicidal Thoughts:  No  Homicidal Thoughts:  No  Memory:  WNL  Judgement:  Good  Insight:  Good  Psychomotor Activity:  Normal  Concentration:  Concentration: Good  Recall:  Good  Fund of Knowledge: Good  Language: Good  Assets:  Desire for Improvement  ADL's:  Intact  Cognition: WNL  Prognosis:  Good    DIAGNOSES:    ICD-10-CM   1. Moderate depressed bipolar I disorder (HCC)  F31.32   2. Encounter for long-term (current) use of medications  Z79.899 TSH    Comprehensive metabolic panel  Lithium level  3. Generalized anxiety disorder  F41.1     Receiving Psychotherapy: No   [name], [facility]   RECOMMENDATIONS:  Wean off Seroquel by taking 100 mg, 2 nightly for 5 nights, then 1-1/2 nightly for 5 nights, then 1 nightly for 5 nights then half nightly for 5 nights and then stop.  If he has trouble sleeping, call the office and I will call in 25 mg. Start lithium 300 mg 1 nightly for 3 nights and then 2 nightly. Continue Xanax 1 mg, half to 1 p.o.  twice daily PRN.  PDMP was reviewed. Have labs drawn approximately 10 days after starting the lithium.  He verbalizes understanding. He will think about seeing [name], [facility] again.  States he may need to switch to someone here in the office, due to insurance purposes. Return in 4 to 6 weeks.  Ronald Overlyeresa Jaline Pincock, PA-C   This record has been created using [employer] software.  Chart creation errors have been sought, but may not always have been located and corrected. Such creation errors do not reflect on the standard of medical care.

## 2018-10-19 ENCOUNTER — Other Ambulatory Visit: Payer: Self-pay | Admitting: Physician Assistant

## 2018-10-27 ENCOUNTER — Ambulatory Visit: Payer: No Typology Code available for payment source | Admitting: Physician Assistant

## 2018-11-02 ENCOUNTER — Other Ambulatory Visit: Payer: Self-pay | Admitting: Physician Assistant

## 2018-11-02 DIAGNOSIS — Z79899 Other long term (current) drug therapy: Secondary | ICD-10-CM

## 2018-11-02 LAB — COMPREHENSIVE METABOLIC PANEL
AG Ratio: 1.7 (calc) (ref 1.0–2.5)
ALT: 40 U/L (ref 9–46)
AST: 19 U/L (ref 10–40)
Albumin: 4.4 g/dL (ref 3.6–5.1)
Alkaline phosphatase (APISO): 50 U/L (ref 36–130)
BUN: 10 mg/dL (ref 7–25)
CO2: 23 mmol/L (ref 20–32)
Calcium: 9.3 mg/dL (ref 8.6–10.3)
Chloride: 104 mmol/L (ref 98–110)
Creat: 0.92 mg/dL (ref 0.60–1.35)
Globulin: 2.6 g/dL (calc) (ref 1.9–3.7)
Glucose, Bld: 83 mg/dL (ref 65–99)
Potassium: 3.8 mmol/L (ref 3.5–5.3)
Sodium: 140 mmol/L (ref 135–146)
Total Bilirubin: 0.5 mg/dL (ref 0.2–1.2)
Total Protein: 7 g/dL (ref 6.1–8.1)

## 2018-11-02 LAB — LITHIUM LEVEL: Lithium Lvl: <0.3 mmol/L — ABNORMAL LOW (ref 0.6–1.2)

## 2018-11-02 LAB — TSH: TSH: 2.6 mIU/L (ref 0.40–4.50)

## 2018-11-02 NOTE — Progress Notes (Signed)
I also let him know other labs were good.

## 2018-11-02 NOTE — Progress Notes (Signed)
Please check w/ him.  Is he taking the Lithium as directed?  It should be 600 mg total.  If so, increase to 900mg .  Repeat Li level in 1 week after increase.  Or start taking it as directed and still get lab.  I'll order lab and please mail order to him.   Kidney function and thyroid are good.

## 2018-11-02 NOTE — Progress Notes (Signed)
Pt. States he did miss a couple doses before labs were drawn. He will increase to 900 Mg and get labs rechecked in a week. I will print labs and mail to him.

## 2018-11-16 ENCOUNTER — Encounter: Payer: Self-pay | Admitting: Physician Assistant

## 2018-11-16 ENCOUNTER — Other Ambulatory Visit: Payer: Self-pay

## 2018-11-16 ENCOUNTER — Ambulatory Visit (INDEPENDENT_AMBULATORY_CARE_PROVIDER_SITE_OTHER): Payer: No Typology Code available for payment source | Admitting: Physician Assistant

## 2018-11-16 DIAGNOSIS — Z79899 Other long term (current) drug therapy: Secondary | ICD-10-CM

## 2018-11-16 DIAGNOSIS — F3132 Bipolar disorder, current episode depressed, moderate: Secondary | ICD-10-CM | POA: Diagnosis not present

## 2018-11-16 DIAGNOSIS — F411 Generalized anxiety disorder: Secondary | ICD-10-CM

## 2018-11-16 NOTE — Progress Notes (Signed)
[facility]  Patient ID: [name],  MRN: [id number]  PCP: Wanda Plump, MD  Date of Evaluation: 11/16/2018 Time spent:15 minutes  Chief Complaint:  Chief Complaint    Follow-up      HISTORY/CURRENT STATUS: HPI for routine med check.  At the last visit, we weaned him off the Seroquel and started lithium.  His lithium level was 0.3 on 11/01/2018 that he states he had accidentally skipped the lithium for a couple of days prior to having the lab drawn.  BUN and creatinine were normal as well as TSH.  States he is feeling some better and not as depressed.  He had some nausea for a week or so after starting the lithium but none since.  Energy and motivation are improved.  He is not isolating.  Denies suicidal or homicidal thoughts.  He is sleeping well.  For a few nights after weaning completely off the Seroquel, he only slept about 4 hours.  But then after that, his sleep has returned to normal.  Still has anxiety at times but controlled with Xanax.  Patient denies increased energy with decreased need for sleep, no increased talkativeness, no racing thoughts, no impulsivity or risky behaviors, no increased spending, no increased libido, no grandiosity.  Denies dizziness, syncope, seizures, numbness, tingling, tremor, tics, unsteady gait, slurred speech, confusion. Denies muscle or joint pain, stiffness, or dystonia.  Individual Medical History/ Review of Systems: Changes? :No    Past medications for mental health diagnoses include: Lithium, BuSpar, Xanax, Seroquel, Risperdal, Depakote, Zoloft  Allergies: Patient has no known allergies.  Current Medications:  Current Outpatient Medications:  .  ALPRAZolam (XANAX) 1 MG tablet, Take 0.5-1 tablets (0.5-1 mg total) by mouth 2 (two) times daily as needed for anxiety., Disp: 30 tablet, Rfl: 5 .  lithium 300 MG tablet, TAKE 1 TABLET AT BEDTIME FOR 3 NIGHTS, THEN 2 TABLETS AT BEDTIME (Patient taking differently: Take 300  mg by mouth. 3 TABLETS AT BEDTIME), Disp: 180 tablet, Rfl: 0 Medication Side Effects: none  Family Medical/ Social History: Changes? No  MENTAL HEALTH EXAM:  There were no vitals taken for this visit.There is no height or weight on file to calculate BMI.  General Appearance: Casual, Neat, Well Groomed and Obese  Eye Contact:  Good  Speech:  Clear and Coherent  Volume:  Normal  Mood:  Euthymic  Affect:  Appropriate  Thought Process:  Goal Directed and Descriptions of Associations: Intact  Orientation:  Full (Time, Place, and Person)  Thought Content: Logical   Suicidal Thoughts:  No  Homicidal Thoughts:  No  Memory:  WNL  Judgement:  Good  Insight:  Good  Psychomotor Activity:  Normal  Concentration:  Concentration: Good  Recall:  Good  Fund of Knowledge: Good  Language: Good  Assets:  Desire for Improvement  ADL's:  Intact  Cognition: WNL  Prognosis:  Good  See labs on chart.  Lithium was 0.3 on 11/01/2018  DIAGNOSES:    ICD-10-CM   1. Moderate depressed bipolar I disorder (HCC)  F31.32   2. Encounter for long-term (current) use of medications  Z79.899 Lithium level  3. Generalized anxiety disorder  F41.1     Receiving Psychotherapy: Yes Ulice Bold, [facility].   RECOMMENDATIONS:  Continue lithium 300 mg, 3 p.o. nightly. Continue Xanax 1 mg, 1/2-1 twice daily as needed anxiety. Draw lithium level within 1 week.  (He states he is having a hard time getting appointment that he will have it next week.)  He has been seeing Rosary Lively, [facility] for several years.  She has recently left our practice and he requests a referral to someone else in our practice.  With his personality and the different counselors we have here, I either recommend Rinaldo Cloud, Lanetta Inch, or Fred May.  He will make an appointment with one of them. Return in 2 months.  Donnal Moat, PA-C

## 2018-11-29 ENCOUNTER — Other Ambulatory Visit: Payer: Self-pay | Admitting: Physician Assistant

## 2018-11-30 LAB — LITHIUM LEVEL: Lithium Lvl: 0.5 mmol/L — ABNORMAL LOW (ref 0.6–1.2)

## 2018-11-30 NOTE — Progress Notes (Signed)
Spoke with pt. And he states he feels good and would like to leave it as is for the moment. I  asked him to let us know if anything changes.

## 2018-11-30 NOTE — Progress Notes (Signed)
Please let him know Nicoletta Dress level is better but still slightly lower than I'd like.  It's 0.5 and it would be best if it's 0.6-1.2.  If he's feeling good on this dose, (900mg ) leave it the same, if he feels like he could have more improvement w/ depression sx, increase Li to 1200mg  (4 pills) and will need to get lab in 1 week.  Let me know and I'll order lab.

## 2018-12-06 ENCOUNTER — Ambulatory Visit (INDEPENDENT_AMBULATORY_CARE_PROVIDER_SITE_OTHER): Payer: No Typology Code available for payment source | Admitting: Mental Health

## 2018-12-06 ENCOUNTER — Other Ambulatory Visit: Payer: Self-pay

## 2018-12-06 DIAGNOSIS — F3132 Bipolar disorder, current episode depressed, moderate: Secondary | ICD-10-CM

## 2018-12-06 DIAGNOSIS — F411 Generalized anxiety disorder: Secondary | ICD-10-CM
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
## 2018-12-28 ENCOUNTER — Ambulatory Visit (INDEPENDENT_AMBULATORY_CARE_PROVIDER_SITE_OTHER): Payer: No Typology Code available for payment source | Admitting: Mental Health

## 2018-12-28 ENCOUNTER — Other Ambulatory Visit: Payer: Self-pay

## 2018-12-28 DIAGNOSIS — F3132 Bipolar disorder, current episode depressed, moderate: Secondary | ICD-10-CM | POA: Diagnosis not present
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
## 2019-01-11 ENCOUNTER — Ambulatory Visit: Payer: No Typology Code available for payment source | Admitting: Mental Health

## 2019-01-16 ENCOUNTER — Ambulatory Visit: Payer: No Typology Code available for payment source | Admitting: Physician Assistant

## 2019-01-20 ENCOUNTER — Other Ambulatory Visit: Payer: Self-pay | Admitting: Physician Assistant

## 2019-01-20 NOTE — Telephone Encounter (Signed)
Last note says 3 at hs?

## 2019-01-23 IMAGING — DX DG CHEST 2V
2 series · 2 of 2 positions shown · non-contrast
Comparison: 08/20/2014

CLINICAL DATA: 21-year-old male with midsternal chest pain.

EXAM:
CHEST  2 VIEW

[chest pa]
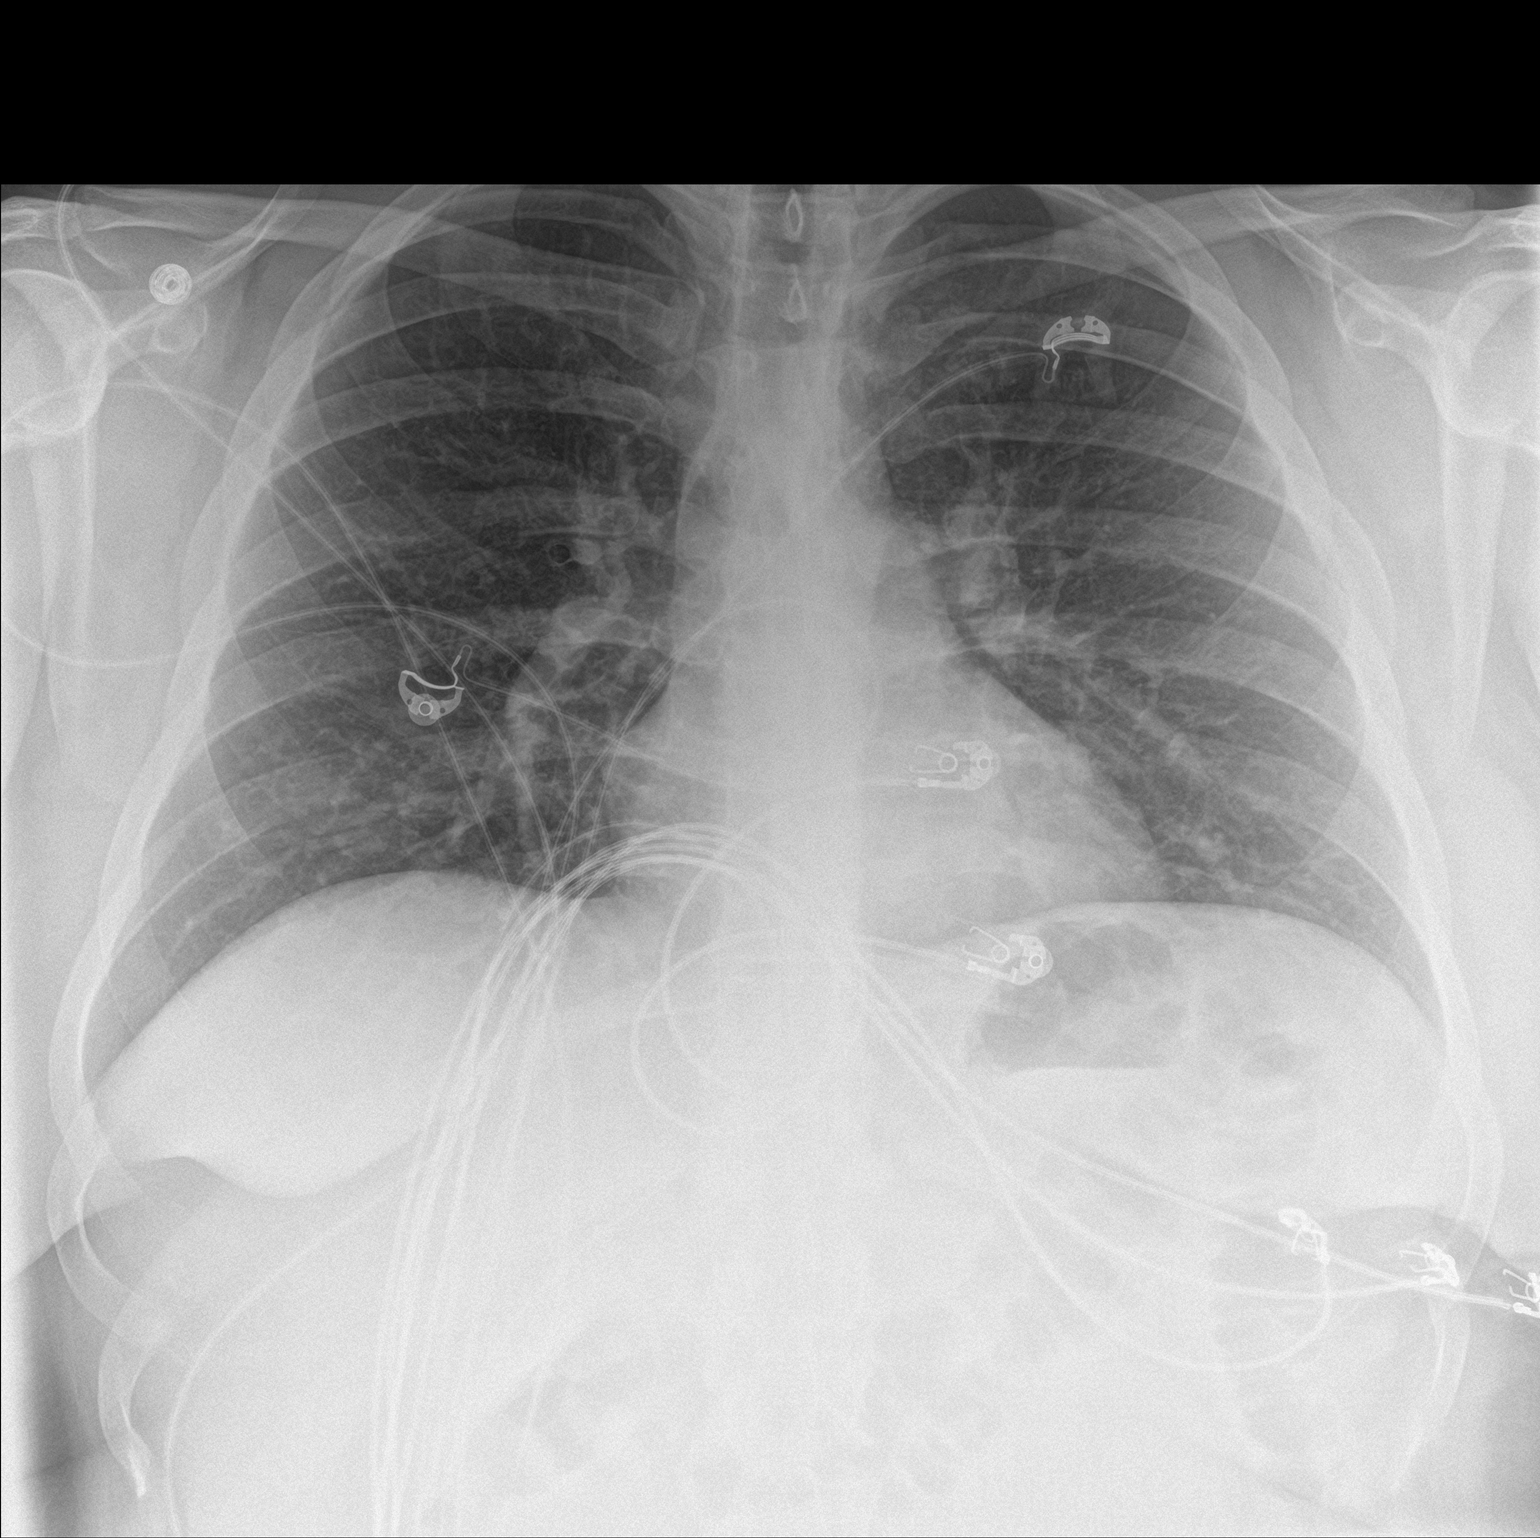

[chest lat]
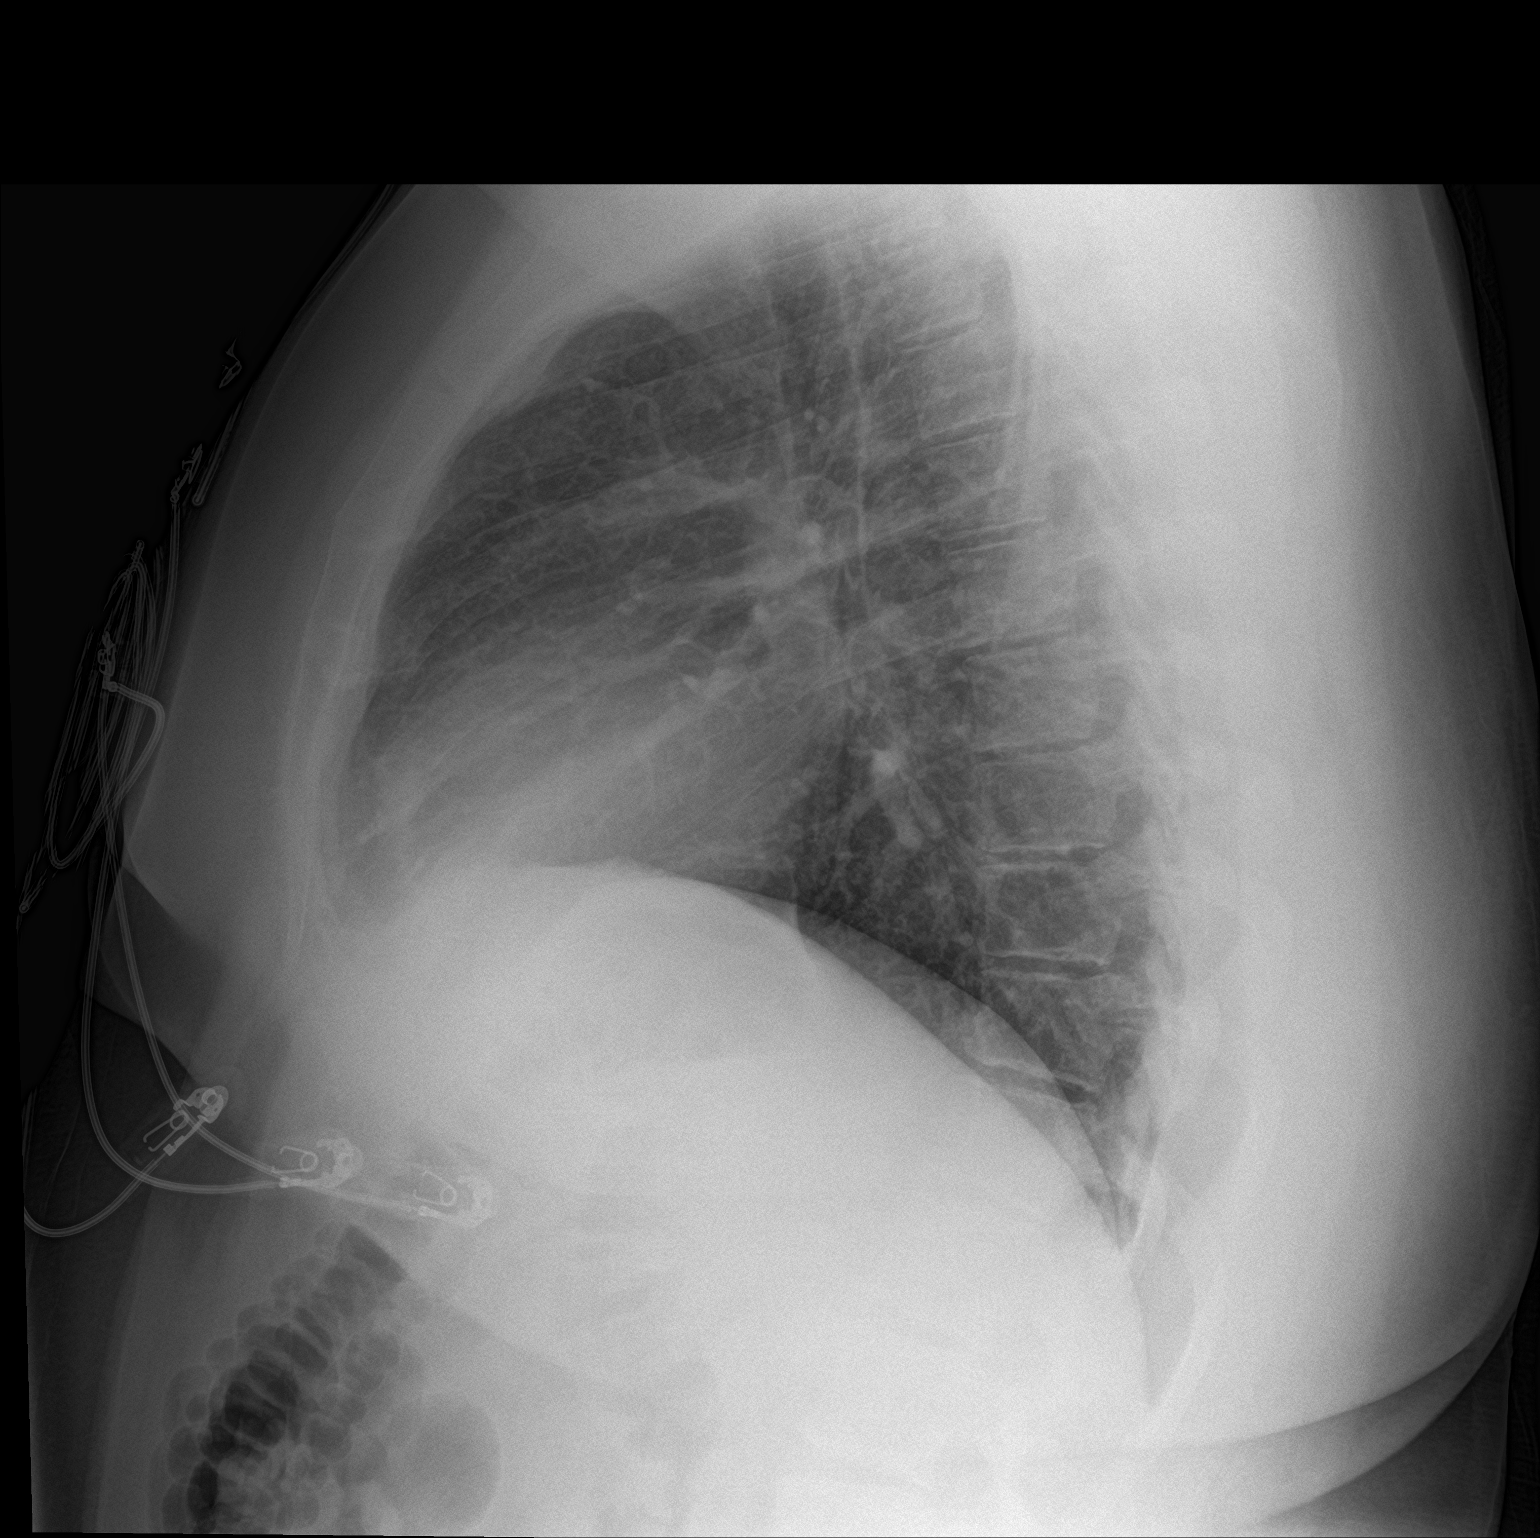

[2 of 2 positions shown; findings below may reference images not displayed]

FINDINGS: The heart size and mediastinal contours are within normal limits.
Low lung volumes with bibasilar atelectasis. Both lungs are
otherwise clear. The visualized skeletal structures are
unremarkable.
IMPRESSION: No active cardiopulmonary disease.

## 2019-01-25 ENCOUNTER — Ambulatory Visit: Payer: No Typology Code available for payment source | Admitting: Mental Health

## 2019-02-08 ENCOUNTER — Other Ambulatory Visit: Payer: Self-pay

## 2019-02-08 ENCOUNTER — Ambulatory Visit (INDEPENDENT_AMBULATORY_CARE_PROVIDER_SITE_OTHER): Payer: No Typology Code available for payment source | Admitting: Mental Health

## 2019-02-08 DIAGNOSIS — F3132 Bipolar disorder, current episode depressed, moderate: Secondary | ICD-10-CM

## 2019-02-08 NOTE — Progress Notes (Signed)
[facility] Note  Name: [name] Date: 02/08/2019 MRN: [id number] DOB: [birth year] PCP: Wanda Plump, MD  Time spent: 54 minutes  Treatment: individual therapy   Mental Status Exam:   Appearance:   Casual     Behavior:  Appropriate  Motor:  Normal  Speech/Language:   Clear and Coherent  Affect:  Constricted  Mood:  depressed  Thought process:  normal  Thought content:    WNL  Sensory/Perceptual disturbances:    WNL  Orientation:  x4  Attention:  Good  Concentration:  Good  Memory:  WNL  Fund of knowledge:   Good  Insight:    Good  Judgment:   Good  Impulse Control:  Good  ' Risk Assessment: Danger to Self: No Self-injurious Behavior: No Danger to Others: No Duty to Warn:no Physical Aggression / Violence:No  Access to Firearms a concern: No  Gang Involvement:No  Patient / guardian was educated about steps to take if suicide or homicide risk level increases between visits: yes While future psychiatric events cannot be accurately predicted, the patient does not currently require acute inpatient psychiatric care and does not currently meet [facility] involuntary commitment criteria.   Subjective:  Patient arrived on time for today's session in no distress.  He shared progress since her last session which was about 1 month ago.  He shared how he is doing well with his relationship with his father, how his father has been home more over the Christmas holidays as he had a 3-week break from work and how he enjoyed this time with him.  He stated he has given thought about his life goals and plans to go into graphic design possibly entering college in the fall.  He shared how he wants to take steps by losing weight by changing his diet and exercising as well.  He also identified wanting to find a part-time job as he identified his struggle day-to-day and finding purpose.  Normalized some feelings and assisted him in identifying steps to take specifically with  organizing and planning daily and weekly goals to achieve these identified life changes.  Gave him homework assignment to use SMART goal setting as discussed last session specifically today with identifying 3 potential job prospects.  He plans to follow through today and identify subsequent goals over the coming days weeks for review next session.  Interventions: CBT, supportive therapy, solution focused approaches  Diagnoses:    ICD-10-CM   1. Moderate depressed bipolar I disorder (HCC)  F31.32     Plan: Patient is to use CBT, mindfulness and coping skills to help manage decrease symptoms associated with their diagnosis.  Patient to follow daily goal setting.    Long-term goal:   Reduce overall level, frequency, and intensity of the feelings of depression, anxiety and panic evidenced by decreased irritability, negative self talk, and helpless feelings from 6 to 7 days/week to 0 to 1 days/week per client report for at least 3 consecutive months.  Short-term goal:  Verbally express understanding of the relationship between feelings of depression, anxiety and their impact on thinking patterns and behaviors. Follow through with exercise regiment toward weight loss Follow through with reviewing SAT testing signs to secure injury to college Utilize coping skills as discussed in session   Progress: Initial plan   [name] Session, [facility]

## 2019-02-14 ENCOUNTER — Other Ambulatory Visit: Payer: Self-pay

## 2019-02-14 ENCOUNTER — Ambulatory Visit (INDEPENDENT_AMBULATORY_CARE_PROVIDER_SITE_OTHER): Payer: No Typology Code available for payment source | Admitting: Physician Assistant

## 2019-02-14 ENCOUNTER — Encounter: Payer: Self-pay | Admitting: Physician Assistant

## 2019-02-14 DIAGNOSIS — Z79899 Other long term (current) drug therapy: Secondary | ICD-10-CM

## 2019-02-14 DIAGNOSIS — F411 Generalized anxiety disorder: Secondary | ICD-10-CM | POA: Diagnosis not present

## 2019-02-14 DIAGNOSIS — F3132 Bipolar disorder, current episode depressed, moderate: Secondary | ICD-10-CM | POA: Diagnosis not present

## 2019-02-14 DIAGNOSIS — R454 Irritability and anger: Secondary | ICD-10-CM | POA: Diagnosis not present

## 2019-02-14 MED ORDER — LITHIUM CARBONATE 300 MG PO TABS: 1200.0000 mg | ORAL_TABLET | Freq: Every day | ORAL | 0 refills | Status: DC

## 2019-02-14 MED ORDER — ALPRAZOLAM 1 MG PO TABS: 0.5000 mg | ORAL_TABLET | Freq: Two times a day (BID) | ORAL | 5 refills | Status: DC | PRN

## 2019-02-14 NOTE — Progress Notes (Signed)
[facility]  Patient ID: [name],  MRN: [id number]  PCP: Colon Branch, MD  Date of Evaluation: 02/14/2019 Time spent:30 minutes  Chief Complaint:  Chief Complaint    Follow-up      HISTORY/CURRENT STATUS: HPI not doing as well as he was.  Patient states he has been having more irritability.  He is also not sleeping but about 5 hours every night.  He does not miss the sleep.  He denies increased energy however.  No impulsivity, risky behavior, increased libido or increased spending.  No grandiosity.  No hallucinations.  About a month ago he did have more symptoms of depression.  He lacked motivation and energy.  States that it is not as bad as it has been in the past.  He usually does have some depression in the wintertime and this year was not as bad and it did not last as long.  No suicidal or homicidal thoughts.  He still has anxiety but relieved by Xanax.  He tries not to take it more than 4 or 5 times a week.  He can feel panicky which is usually triggered by something but not always.  If he does not take the Xanax, he will have a full-blown panic attack that can last 30 minutes to an hour or so.  When that happens he has palpitations, sweaty palms, and just feels antsy.  After the last visit when reviewing labs, I suggested he increase the lithium if he was not feeling as well as he had been.  At that time, in November, he was doing well so we did not increase the lithium.  He is in therapy with Lanetta Inch, [facility].  States that is going well.  He denies excessive thirst, increased appetite, polyuria.  He denies any dizziness or fainting.  No tremor.  No muscle or joint pain.  Individual Medical History/ Review of Systems: Changes? :No   Allergies: Patient has no known allergies.  Current Medications:  Current Outpatient Medications:  .  ALPRAZolam (XANAX) 1 MG tablet, Take 0.5-1 tablets (0.5-1 mg total) by mouth 2 (two) times daily as needed for  anxiety., Disp: 30 tablet, Rfl: 5 .  lithium 300 MG tablet, Take 4 tablets (1,200 mg total) by mouth at bedtime., Disp: 360 tablet, Rfl: 0 Medication Side Effects: none  Family Medical/ Social History: Changes? Yes he has not been working due to the weather.  He was working in Biomedical scientist.  He plans to start looking for another job soon.  MENTAL HEALTH EXAM:  There were no vitals taken for this visit.There is no height or weight on file to calculate BMI.  General Appearance: Casual, Neat, Well Groomed and Obese  Eye Contact:  Good  Speech:  Clear and Coherent  Volume:  Normal  Mood:  Euthymic  Affect:  Appropriate  Thought Process:  Goal Directed  Orientation:  Full (Time, Place, and Person)  Thought Content: Logical   Suicidal Thoughts:  No  Homicidal Thoughts:  No  Memory:  WNL  Judgement:  Good  Insight:  Good  Psychomotor Activity:  Normal  Concentration:  Concentration: Good  Recall:  Good  Fund of Knowledge: Good  Language: Good  Assets:  Desire for Improvement  ADL's:  Intact  Cognition: WNL  Prognosis:  Good  11/29/2018 lithium level was 0.5.  DIAGNOSES:    ICD-10-CM   1. Moderate depressed bipolar I disorder (HCC)  F31.32   2. Encounter for long-term (current) use of  medications  Z79.899 Lithium level  3. Generalized anxiety disorder  F41.1   4. Irritability  R45.4     Receiving Psychotherapy: Yes Elio Forget, [facility]   RECOMMENDATIONS:  I spent 30 minutes with him. PDMP was reviewed. We discussed increasing the lithium.  He would like to try it. Increase lithium to 1200 mg nightly. Continue Xanax 1 mg, 1/2-1 twice daily as needed anxiety. Draw trough lithium level in 7 to 10 days. Continue therapy with Elio Forget, [facility]. Return in 4 to 6 weeks.  [name], PA-C

## 2019-03-03 ENCOUNTER — Ambulatory Visit: Payer: No Typology Code available for payment source | Admitting: Mental Health

## 2019-03-28 ENCOUNTER — Ambulatory Visit (INDEPENDENT_AMBULATORY_CARE_PROVIDER_SITE_OTHER): Payer: No Typology Code available for payment source | Admitting: Physician Assistant

## 2019-03-28 ENCOUNTER — Other Ambulatory Visit: Payer: Self-pay

## 2019-03-28 ENCOUNTER — Encounter: Payer: Self-pay | Admitting: Physician Assistant

## 2019-03-28 DIAGNOSIS — F411 Generalized anxiety disorder: Secondary | ICD-10-CM | POA: Diagnosis not present

## 2019-03-28 DIAGNOSIS — F3341 Major depressive disorder, recurrent, in partial remission: Secondary | ICD-10-CM

## 2019-03-28 MED ORDER — ALPRAZOLAM 1 MG PO TABS: 0.5000 mg | ORAL_TABLET | Freq: Two times a day (BID) | ORAL | 5 refills | Status: DC | PRN

## 2019-03-28 MED ORDER — LITHIUM CARBONATE 600 MG PO CAPS: 1200.0000 mg | ORAL_CAPSULE | Freq: Every day | ORAL | 0 refills | Status: DC

## 2019-03-28 NOTE — Progress Notes (Signed)
[facility]  Patient ID: [name],  MRN: [id number]  PCP: Wanda Plump, MD  Date of Evaluation: 03/28/2019 Time spent:20 minutes  Chief Complaint:  Chief Complaint    Anxiety; Depression      HISTORY/CURRENT STATUS: HPI for 6-week med check.  At the last visit, we increased the lithium.  He states he is feeling a lot better and not hardly depressed at all.  He is able to enjoy things, energy and motivation are good, appetite is stable, he is still gradually losing weight and doing great with that.  Denies suicidal or homicidal thoughts.    He forgot to get his labs drawn but he does have an appointment next week to have it done.   Anxiety has been a little heightened with everything going on in the world.  The pandemic has caused more anxiety.  He is needing the Xanax usually twice a day now.  He does not really have panic attacks but is more of a generalized sense of anxiety.  The Xanax does help.  He sleeps well most of the time and has no complaints there.  Patient denies increased energy with decreased need for sleep, no increased talkativeness, no racing thoughts, no impulsivity or risky behaviors, no increased spending, no increased libido, no grandiosity.  No irritability, no hallucinations.  Denies dizziness, syncope, seizures, numbness, tingling, tremor, tics, unsteady gait, slurred speech, confusion. Denies muscle or joint pain, stiffness, or dystonia.  Individual Medical History/ Review of Systems: Changes? :No    Past medications for mental health diagnoses include: Lithium, BuSpar, Xanax, Seroquel, Risperdal, Depakote, Zoloft  Allergies: Patient has no known allergies.  Current Medications:  Current Outpatient Medications:  .  ALPRAZolam (XANAX) 1 MG tablet, Take 0.5-1 tablets (0.5-1 mg total) by mouth 2 (two) times daily as needed for anxiety., Disp: 60 tablet, Rfl: 5 .  lithium 600 MG capsule, Take 2 capsules (1,200 mg total) by mouth at  bedtime., Disp: 180 capsule, Rfl: 0 Medication Side Effects: none  Family Medical/ Social History: Changes? No.  Not working yet.  He plans to look for a job after he is vaccinated for COVID.  MENTAL HEALTH EXAM:  There were no vitals taken for this visit.There is no height or weight on file to calculate BMI.  General Appearance: Casual, Neat, Well Groomed and Obese  Eye Contact:  Good  Speech:  Clear and Coherent and Normal Rate  Volume:  Normal  Mood:  Euthymic  Affect:  Appropriate  Thought Process:  Goal Directed and Descriptions of Associations: Intact  Orientation:  Full (Time, Place, and Person)  Thought Content: Logical   Suicidal Thoughts:  No  Homicidal Thoughts:  No  Memory:  WNL  Judgement:  Good  Insight:  Good  Psychomotor Activity:  Normal  Concentration:  Concentration: Good  Recall:  Good  Fund of Knowledge: Good  Language: Good  Assets:  Desire for Improvement  ADL's:  Intact  Cognition: WNL  Prognosis:  Good    DIAGNOSES:    ICD-10-CM   1. Recurrent major depressive disorder, in partial remission (HCC)  F33.41   2. Generalized anxiety disorder  F41.1     Receiving Psychotherapy: Yes  With Elio Forget, [facility].   RECOMMENDATIONS:  PDMP was reviewed. I spent 20 minutes with him. Importance stressed of having the lithium level done, d/t risk of toxicity.  He understands and will definitely go for his appointment next week. Continue Xanax 1 mg, 1/2-1 p.o. twice  daily as needed. Continue lithium 1200 mg nightly.  I changed from tablets to 600 mg capsules and he will take 2 nightly.  He prefers to try this rather than taking it 4 pills of the tablets. Continue therapy with Lanetta Inch, [facility]. Return in 3 months.  Donnal Moat, PA-C

## 2019-03-30 ENCOUNTER — Other Ambulatory Visit: Payer: Self-pay

## 2019-03-30 ENCOUNTER — Ambulatory Visit (INDEPENDENT_AMBULATORY_CARE_PROVIDER_SITE_OTHER): Payer: No Typology Code available for payment source | Admitting: Mental Health

## 2019-03-30 DIAGNOSIS — F3341 Major depressive disorder, recurrent, in partial remission: Secondary | ICD-10-CM

## 2019-03-30 NOTE — Progress Notes (Signed)
[facility]  Name: [name] Date: 03/30/2019 MRN: [id number] DOB: 14-Dec-1995 PCP: Wanda Plump, MD  Time spent: 54 minutes  Treatment: individual therapy  Mental Status Exam:   Appearance:   Casual     Behavior:  Appropriate  Motor:  Normal  Speech/Language:   Clear and Coherent  Affect:  Constricted  Mood:  depressed  Thought process:  normal  Thought content:    WNL  Sensory/Perceptual disturbances:    WNL  Orientation:  x4  Attention:  Good  Concentration:  Good  Memory:  WNL  Fund of knowledge:   Good  Insight:    Good  Judgment:   Good  Impulse Control:  Good  ' Risk Assessment: Danger to Self: No Self-injurious Behavior: No Danger to Others: No Duty to Warn:no Physical Aggression / Violence:No  Access to Firearms a concern: No  Gang Involvement:No  Patient / guardian was educated about steps to take if suicide or homicide risk level increases between visits: yes While future psychiatric events cannot be accurately predicted, the patient does not currently require acute inpatient psychiatric care and does not currently meet [facility] involuntary commitment criteria.   Subjective:  He stated he has been feeling less depressed, feels his increase in medication and not worrying about his father getting Covid as he has been vacinated.  He went on to share some of his continued difficulties with motivation, not following through on tasks.  He continues to voice intention to go to college and needing to prepare himself for the SAT.  Collaboratively explored ways to increase his consistency with daily scheduling, utilizing a daily calendar with task lists.  He also expressed a desire to improve his health by weight loss and this was discussed further with management and scheduling to follow through on between sessions.  Interventions: CBT, supportive therapy, solution focused approaches  Diagnoses:  MDD, Recurrent, moderate  Plan: Patient  is to use CBT, mindfulness and coping skills to help manage decrease symptoms associated with their diagnosis.  Patient to follow daily goal setting.    Long-term goal:   Reduce overall level, frequency, and intensity of the feelings of depression, anxiety and panic evidenced by decreased irritability, negative self talk, and helpless feelings from 6 to 7 days/week to 0 to 1 days/week per client report for at least 3 consecutive months.  Short-term goal:  Verbally express understanding of the relationship between feelings of depression, anxiety and their impact on thinking patterns and behaviors. Follow through with exercise regiment toward weight loss Follow through with reviewing SAT testing signs to secure injury to college Utilize coping skills as discussed in session   Progress: Initial plan   [name] Session, [facility]

## 2019-04-04 LAB — LITHIUM LEVEL: Lithium Lvl: 0.6 mmol/L (ref 0.6–1.2)

## 2019-04-04 NOTE — Progress Notes (Signed)
Pt. Made aware.

## 2019-04-04 NOTE — Progress Notes (Signed)
[name], please let him know the Dierdre Searles level is still low at 0.6. This is acceptable as long as he's feeling okay.  If he's having more depression sx, let me know and we'll increase the Li even more.  Thanks

## 2019-04-16 ENCOUNTER — Other Ambulatory Visit: Payer: Self-pay | Admitting: Physician Assistant

## 2019-04-24 ENCOUNTER — Ambulatory Visit (INDEPENDENT_AMBULATORY_CARE_PROVIDER_SITE_OTHER): Payer: No Typology Code available for payment source | Admitting: Mental Health

## 2019-04-24 ENCOUNTER — Other Ambulatory Visit: Payer: Self-pay

## 2019-04-24 DIAGNOSIS — F411 Generalized anxiety disorder: Secondary | ICD-10-CM | POA: Diagnosis not present

## 2019-04-24 DIAGNOSIS — F3341 Major depressive disorder, recurrent, in partial remission: Secondary | ICD-10-CM

## 2019-04-24 NOTE — Progress Notes (Signed)
[facility]  Name: [name] Date: 04/24/2019 MRN: [id number] DOB: 1995-10-01 PCP: Ronald Plump, MD  Time spent: 45 minutes  Treatment: individual therapy  Mental Status Exam:   Appearance:   Casual     Behavior:  Appropriate  Motor:  Normal  Speech/Language:   Clear and Coherent  Affect:  Constricted  Mood:  depressed  Thought process:  normal  Thought content:    WNL  Sensory/Perceptual disturbances:    WNL  Orientation:  x4  Attention:  Good  Concentration:  Good  Memory:  WNL  Fund of knowledge:   Good  Insight:    Good  Judgment:   Good  Impulse Control:  Good  ' Risk Assessment: Danger to Self: No Self-injurious Behavior: No Danger to Others: No Duty to Warn:no Physical Aggression / Violence:No  Access to Firearms a concern: No  Gang Involvement:No  Patient / guardian was educated about steps to take if suicide or homicide risk level increases between visits: yes While future psychiatric events cannot be accurately predicted, the patient does not currently require acute inpatient psychiatric care and does not currently meet [facility] involuntary commitment criteria.   Subjective:  Patient presents for session, sharing how he continues to cope with low motivation.  He stated his mood has been better, less depressed, continues to endorse some anxiety which decreases his motivation to get tasks completed.  Through discussion and guided discovery, he identified having the desire to attend college in the fall, however, struggles to take the necessary steps to begin the process of registering for classes etc.  He identified the negative cognition "I might fail" referring to failing classes if he attends school.  Through discussion, he denied having significant academic problems in high school, that he didn't try as hard as he could have but verbalize how he feels he could get through college and take an average weighted courseload as well.  We  reviewed some concepts from previous sessions related to SMART goal setting continuing to provide some psychoeducation and collaboratively in session identified potential steps in creating some goals to follow through on between sessions.  Today, he identified specifically reviewing the registration process for school by 1 PM.  He verbalized his plan to continue to set other goals and by writing them down.  He also plans to write down his exercise schedule which currently consists of walking at least 2 times a week but to increase.  Interventions: CBT, supportive therapy, solution focused approaches  Diagnoses:  MDD, Recurrent, moderate  Plan: Patient is to use CBT, mindfulness and coping skills to help manage decrease symptoms associated with their diagnosis.  Patient to follow daily goal setting.    Long-term goal:   Reduce overall level, frequency, and intensity of the feelings of depression, anxiety and panic evidenced by decreased irritability, negative self talk, and helpless feelings from 6 to 7 days/week to 0 to 1 days/week per client report for at least 3 consecutive months.  Short-term goal:  Verbally express understanding of the relationship between feelings of depression, anxiety and their impact on thinking patterns and behaviors. Follow through with exercise regiment toward weight loss Follow through with reviewing SAT testing signs to secure injury to college Utilize coping skills as discussed in session   Progress: Initial plan   [name] Session, [facility]

## 2019-05-25 ENCOUNTER — Ambulatory Visit: Payer: No Typology Code available for payment source | Admitting: Psychiatry

## 2019-06-22 ENCOUNTER — Other Ambulatory Visit: Payer: Self-pay | Admitting: Physician Assistant

## 2019-06-27 ENCOUNTER — Ambulatory Visit (INDEPENDENT_AMBULATORY_CARE_PROVIDER_SITE_OTHER): Payer: No Typology Code available for payment source | Admitting: Mental Health

## 2019-06-27 ENCOUNTER — Other Ambulatory Visit: Payer: Self-pay

## 2019-06-27 DIAGNOSIS — F411 Generalized anxiety disorder: Secondary | ICD-10-CM

## 2019-06-27 DIAGNOSIS — F3341 Major depressive disorder, recurrent, in partial remission: Secondary | ICD-10-CM | POA: Diagnosis not present

## 2019-06-27 NOTE — Progress Notes (Signed)
[facility]  Name: [name] Date: 06/27/2019 MRN: [id number] DOB: 1995-03-15 PCP: Wanda Plump, MD  Time spent: 50 minutes  Treatment: individual therapy  Mental Status Exam:   Appearance:   Casual     Behavior:  Appropriate  Motor:  Normal  Speech/Language:   Clear and Coherent  Affect:  Full range  Mood:  euthymic  Thought process:  normal  Thought content:    WNL  Sensory/Perceptual disturbances:    WNL  Orientation:  x4  Attention:  Good  Concentration:  Good  Memory:  WNL  Fund of knowledge:   Good  Insight:    Good  Judgment:   Good  Impulse Control:  Good  ' Risk Assessment: Danger to Self: No Self-injurious Behavior: No Danger to Others: No Duty to Warn:no Physical Aggression / Violence:No  Access to Firearms a concern: No  Gang Involvement:No  Patient / guardian was educated about steps to take if suicide or homicide risk level increases between visits: yes While future psychiatric events cannot be accurately predicted, the patient does not currently require acute inpatient psychiatric care and does not currently meet [facility] involuntary commitment criteria.   Subjective:  Patient presents for session in no apparent distress. 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 Identified he eats to cope w/ his anxiety. Feels he has been living in denial, "I put things off until the last minute". He understands how his tendency to procrastinate leads to more anxiety and this is a perpetual cycle for him for the last few years. He stated that he tends to ruminate at night and eat to cope, focusing on salty foods such as chips. Rumination centered around his future, needing to find a job or plan his educational future toward identifying a career. He identified how he needs to get out more w/ friends, plans to follow through in making plans to do things. He talks w/ them online, gaming. We collaboratively  explore some activities with which he might like to engage with his friends and. He plans to follow through with replacing some of his snack foods at night with healthy choices such as fruits and vegetables as well as beginning his exercise regimen again which was walking. We reviewed the importance of having a plan developed to keep himself consistent daily and provide some psychoeducation regarding habit building.  Interventions: CBT, supportive therapy, solution focused approaches  Diagnoses:  MDD, Recurrent, moderate  Plan: Patient is to use CBT, mindfulness and coping skills to help manage decrease symptoms associated with their diagnosis.  Patient to follow daily goal setting.    Long-term goal:   Reduce overall level, frequency, and intensity of the feelings of depression, anxiety and panic evidenced by decreased irritability, negative self talk, and helpless feelings from 6 to 7 days/week to 0 to 1 days/week per client report for at least 3 consecutive months.  Short-term goal:  Verbally express understanding of the relationship between feelings of depression, anxiety and their impact on thinking patterns and behaviors. Follow through with exercise regiment toward weight loss Follow through with reviewing SAT testing signs to secure injury to college Utilize coping skills as discussed in session   Progress: Initial plan   [name] Session, [facility]

## 2019-06-28 ENCOUNTER — Ambulatory Visit (INDEPENDENT_AMBULATORY_CARE_PROVIDER_SITE_OTHER): Payer: No Typology Code available for payment source | Admitting: Physician Assistant

## 2019-06-28 ENCOUNTER — Encounter: Payer: Self-pay | Admitting: Physician Assistant

## 2019-06-28 DIAGNOSIS — G4700 Insomnia, unspecified: Secondary | ICD-10-CM

## 2019-06-28 DIAGNOSIS — F411 Generalized anxiety disorder: Secondary | ICD-10-CM | POA: Diagnosis not present

## 2019-06-28 DIAGNOSIS — F3341 Major depressive disorder, recurrent, in partial remission: Secondary | ICD-10-CM | POA: Diagnosis not present

## 2019-06-28 MED ORDER — SERTRALINE HCL 50 MG PO TABS: 50.0000 mg | ORAL_TABLET | Freq: Every day | ORAL | 1 refills | Status: DC

## 2019-06-28 NOTE — Progress Notes (Signed)
[facility]  Patient ID: [name],  MRN: [id number]  PCP: Wanda Plump, MD  Date of Evaluation: 06/28/2019 Time spent:30 minutes  Chief Complaint:  Chief Complaint    Follow-up      HISTORY/CURRENT STATUS: HPI for 3 month med check.  Arty is doing really well overall.  He asks whether there is any reason not to take Zoloft and the medications he is already on.  He took Zoloft about 8 years ago.  He remembers that at that time the social anxiety was not as bad.  If possible he would like to try it again.  He is able to enjoy things.  Energy and motivation are pretty good but he remembers that it was better when he was on the Zoloft.  He has been a bit more anxious recently because he is working through some things in therapy which has caused more anxiety, the Xanax does help though.  He needs it mostly in the evening when his mind starts to race.  No SI/HI.  For the past few weeks, he is only been sleeping around 5 to 5-1/2 hours per night.  He does not feel rested when he gets up.  He does not nap during the day.  This is not a common problem and there are no known triggers except for the increased anxiety in the evenings.  Therapy is going well.  He is still not working and hopes to start working around the time of his next birthday.  He will be 25 then and will no longer be on his parents insurance.  He really wants to feel better emotionally and have worked through all of these difficult things before he starts work.  Patient denies increased energy with decreased need for sleep, no increased talkativeness, no racing thoughts, no impulsivity or risky behaviors, no increased spending, no increased libido, no grandiosity.  No irritability, no hallucinations.  Denies dizziness, syncope, seizures, numbness, tingling, tremor, tics, unsteady gait, slurred speech, confusion. Denies muscle or joint pain, stiffness, or dystonia.  Individual Medical History/ Review of  Systems: Changes? :No    Past medications for mental health diagnoses include: Lithium, BuSpar, Xanax, Seroquel, Risperdal, Depakote, Zoloft  Allergies: Patient has no known allergies.  Current Medications:  Current Outpatient Medications:  .  ALPRAZolam (XANAX) 1 MG tablet, Take 0.5-1 tablets (0.5-1 mg total) by mouth 2 (two) times daily as needed for anxiety., Disp: 60 tablet, Rfl: 5 .  lithium 600 MG capsule, TAKE 2 CAPSULES (1,200 MG TOTAL) BY MOUTH AT BEDTIME., Disp: 180 capsule, Rfl: 0 .  sertraline (ZOLOFT) 50 MG tablet, Take 1 tablet (50 mg total) by mouth daily., Disp: 30 tablet, Rfl: 1 Medication Side Effects: none  Family Medical/ Social History: Changes? No.    MENTAL HEALTH EXAM:  There were no vitals taken for this visit.There is no height or weight on file to calculate BMI.  General Appearance: Casual, Neat, Well Groomed and Obese  Eye Contact:  Good  Speech:  Clear and Coherent and Normal Rate  Volume:  Normal  Mood:  Euthymic  Affect:  Appropriate  Thought Process:  Goal Directed and Descriptions of Associations: Intact  Orientation:  Full (Time, Place, and Person)  Thought Content: Logical   Suicidal Thoughts:  No  Homicidal Thoughts:  No  Memory:  WNL  Judgement:  Good  Insight:  Good  Psychomotor Activity:  Normal  Concentration:  Concentration: Good  Recall:  Good  Fund of Knowledge: Good  Language: Good  Assets:  Desire for Improvement  ADL's:  Intact  Cognition: WNL  Prognosis:  Good   Lithium level on 04/03/2019 was 0.6  DIAGNOSES:    ICD-10-CM   1. Generalized anxiety disorder  F41.1   2. Recurrent major depressive disorder, in partial remission ([place])  F33.41   3. Insomnia, unspecified type  G47.00     Receiving Psychotherapy: Yes  With Lanetta Inch, [place]:  PDMP was reviewed. I provided 30 minutes of face-to-face time during this visit. We discussed the sleep problem, sleep hygiene.  If the insomnia continues he  can call and I will send in trazodone.  We did discuss the benefits, risks and side effects and he accepts. It is fine to add Zoloft back into his regimen.  It can definitely help with depression as well as anxiety, which seems to be more needed at this time.  He understands the benefits and risk of it and will let me know if he has any side effects that are intolerable.  We will start at a low dose and increase if needed.  I did discuss that sometimes antidepressants can lead to a manic episode in someone with bipolar disorder.  His diagnosis is still a bit unclear, whether it is major depressive disorder or bipolar 2.  He knows to watch for any manic signs and if they occur, call immediately. Continue Xanax 1 mg, 1/2-1 p.o. twice daily as needed. Continue lithium 600 mg capsules, 2 p.o. nightly. Continue therapy with Lanetta Inch, [facility]. Return in 4 to 6 weeks.  Donnal Moat, PA-C

## 2019-07-20 ENCOUNTER — Other Ambulatory Visit: Payer: Self-pay | Admitting: Physician Assistant

## 2019-07-25 ENCOUNTER — Ambulatory Visit (INDEPENDENT_AMBULATORY_CARE_PROVIDER_SITE_OTHER): Payer: No Typology Code available for payment source | Admitting: Mental Health

## 2019-07-25 ENCOUNTER — Other Ambulatory Visit: Payer: Self-pay

## 2019-07-25 ENCOUNTER — Ambulatory Visit (INDEPENDENT_AMBULATORY_CARE_PROVIDER_SITE_OTHER): Payer: No Typology Code available for payment source | Admitting: Physician Assistant

## 2019-07-25 ENCOUNTER — Encounter: Payer: Self-pay | Admitting: Physician Assistant

## 2019-07-25 DIAGNOSIS — F411 Generalized anxiety disorder: Secondary | ICD-10-CM

## 2019-07-25 DIAGNOSIS — F3132 Bipolar disorder, current episode depressed, moderate: Secondary | ICD-10-CM | POA: Diagnosis not present

## 2019-07-25 MED ORDER — SERTRALINE HCL 100 MG PO TABS: 100.0000 mg | ORAL_TABLET | Freq: Every day | ORAL | 1 refills | Status: DC
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
## 2019-07-25 NOTE — Progress Notes (Signed)
[facility]  Patient ID: [name],  MRN: [id number]  PCP: Wanda Plump, MD  Date of Evaluation: 07/25/2019 Time spent:20 minutes  Chief Complaint:  Chief Complaint    Follow-up      HISTORY/CURRENT STATUS: HPI for f/u after starting Zoloft.  He is feeling a little bit better since starting back on the Zoloft.  He has been on 50 mg for the past month.  States he believes increasing the dose would be more helpful.  At one point several years ago, he was on 200 mg.  He felt more apathetic at that dose, but was also taking other medications at the same time, and he does not really feel that way again.  Right now he has more motivation and energy, which he attributes to the Zoloft.  He is more able to enjoy things.  One of his cousins died last week and that has been sad but he is getting through it.  He continues to be anxious when triggered and the Xanax helps a lot.  The biggest stressor right now is a plumbing problem in his home.  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  He is working on getting the problem fixed.  It is just been a bit nerve-racking.  He still has racing thoughts in the evening sometimes and the Xanax is really helpful at that time.  No SI/HI.  Is sleeping pretty good at this time. Therapy is going well.  He is still not working and hopes to start working around the time of his next birthday.  He will be 25 then and will no longer be on his parents insurance.  He really wants to feel better emotionally and have worked through all of these difficult things before he starts work.  Considering going to work at [employer] because he can get [employer] while working part-time.  Patient denies increased energy with decreased need for sleep, no increased talkativeness, no racing thoughts, no impulsivity or risky behaviors, no increased spending,  no increased libido, no grandiosity.  No irritability, no hallucinations.  Denies dizziness, syncope, seizures, numbness, tingling, tremor, tics, unsteady gait, slurred speech, confusion. Denies muscle or joint pain, stiffness, or dystonia.  Individual Medical History/ Review of Systems: Changes? :No    Past medications for mental health diagnoses include: Lithium, BuSpar, Xanax, Seroquel, Risperdal, Depakote, Zoloft  Allergies: Patient has no known allergies.  Current Medications:  Current Outpatient Medications:  .  ALPRAZolam (XANAX) 1 MG tablet, Take 0.5-1 tablets (0.5-1 mg total) by mouth 2 (two) times daily as needed for anxiety., Disp: 60 tablet, Rfl: 5 .  lithium 600 MG capsule, TAKE 2 CAPSULES (1,200 MG TOTAL) BY MOUTH AT BEDTIME., Disp: 180 capsule, Rfl: 0 .  sertraline (ZOLOFT) 100 MG tablet, Take 1 tablet (100 mg total) by mouth daily., Disp: 30 tablet, Rfl: 1 Medication Side Effects: none  Family Medical/ Social History: Changes? No.    MENTAL HEALTH EXAM:  There were no vitals taken for this visit.There is no height or weight on file to calculate BMI.  General Appearance: Casual, Neat, Well Groomed and Obese  Eye Contact:  Good  Speech:  Clear and Coherent and Normal Rate  Volume:  Normal  Mood:  Euthymic  Affect:  Appropriate  Thought Process:  Goal Directed and Descriptions of Associations: Intact  Orientation:  Full (Time, Place, and  Person)  Thought Content: Logical   Suicidal Thoughts:  No  Homicidal Thoughts:  No  Memory:  WNL  Judgement:  Good  Insight:  Good  Psychomotor Activity:  Normal  Concentration:  Concentration: Good and Attention Span: Good  Recall:  Good  Fund of Knowledge: Good  Language: Good  Assets:  Desire for Improvement  ADL's:  Intact  Cognition: WNL  Prognosis:  Good     DIAGNOSES:    ICD-10-CM   1. Generalized anxiety disorder  F41.1   2. Moderate depressed bipolar I disorder [facility])  F31.32     Receiving Psychotherapy:  Yes  With Elio Forget, [facility] C.   RECOMMENDATIONS:  PDMP was reviewed. I provided 20 minutes face to face time during this encounter.  Increase Zoloft to 100 mg qd.  Continue Xanax 1 mg, 1/2-1 p.o. twice daily as needed. Continue lithium 600 mg capsules, 2 p.o. nightly. At the next office visit, I will give him an order for labs to be done in September. Continue therapy with Elio Forget, [facility]. Return in 4-6 weeks.  [name], PA-C

## 2019-08-17 ENCOUNTER — Other Ambulatory Visit: Payer: Self-pay | Admitting: Physician Assistant

## 2019-08-22 ENCOUNTER — Ambulatory Visit: Payer: No Typology Code available for payment source | Admitting: Physician Assistant

## 2019-08-22 ENCOUNTER — Ambulatory Visit: Payer: No Typology Code available for payment source | Admitting: Mental Health

## 2019-09-11 ENCOUNTER — Encounter: Payer: Self-pay | Admitting: Physician Assistant

## 2019-09-11 ENCOUNTER — Other Ambulatory Visit: Payer: Self-pay

## 2019-09-11 ENCOUNTER — Ambulatory Visit (INDEPENDENT_AMBULATORY_CARE_PROVIDER_SITE_OTHER): Payer: No Typology Code available for payment source | Admitting: Physician Assistant

## 2019-09-11 DIAGNOSIS — F3341 Major depressive disorder, recurrent, in partial remission: Secondary | ICD-10-CM | POA: Diagnosis not present

## 2019-09-11 DIAGNOSIS — F411 Generalized anxiety disorder: Secondary | ICD-10-CM

## 2019-09-11 DIAGNOSIS — Z79899 Other long term (current) drug therapy: Secondary | ICD-10-CM

## 2019-09-11 MED ORDER — LITHIUM CARBONATE 600 MG PO CAPS: 1200.0000 mg | ORAL_CAPSULE | Freq: Every day | ORAL | 0 refills | Status: DC

## 2019-09-11 NOTE — Progress Notes (Signed)
[facility]  Patient ID: [name],  MRN: [id number]  PCP: Wanda Plump, MD  Date of Evaluation: 09/11/2019 Time spent:20 minutes  Chief Complaint:  Chief Complaint    Anxiety; Depression; Follow-up      HISTORY/CURRENT STATUS: HPI for f/u after starting Zoloft.  Doing well since we increased Zoloft at the last visit.  He is able to enjoy things more now.  Energy and motivation are better, but because of the heat, he still does not feel like working outside at all.  He does not work outside the home but does do chores in the house for the family.  Sleeps well most of the time.  He is not isolating.  No suicidal or homicidal thoughts.  The anxiety is well controlled.  The Xanax does help and he usually does need it twice a day.  He is not having panic attacks but more so generalized anxiety.  Patient denies increased energy with decreased need for sleep, no increased talkativeness, no racing thoughts, no impulsivity or risky behaviors, no increased spending, no increased libido, no grandiosity.  No irritability, no hallucinations.  Denies dizziness, syncope, seizures, numbness, tingling, tremor, tics, unsteady gait, slurred speech, confusion. Denies muscle or joint pain, stiffness, or dystonia.  Individual Medical History/ Review of Systems: Changes? :No    Past medications for mental health diagnoses include: Lithium, BuSpar, Xanax, Seroquel, Risperdal, Depakote, Zoloft  Allergies: Patient has no known allergies.  Current Medications:  Current Outpatient Medications:  .  ALPRAZolam (XANAX) 1 MG tablet, Take 0.5-1 tablets (0.5-1 mg total) by mouth 2 (two) times daily as needed for anxiety., Disp: 60 tablet, Rfl: 5 .  lithium 600 MG capsule, Take 2 capsules (1,200 mg total) by mouth at bedtime., Disp: 180 capsule, Rfl: 0 .  sertraline (ZOLOFT) 100 MG tablet, TAKE 1 TABLET BY MOUTH EVERY DAY, Disp: 90 tablet, Rfl: 0 Medication Side Effects: none  Family  Medical/ Social History: Changes? No.    MENTAL HEALTH EXAM:  There were no vitals taken for this visit.There is no height or weight on file to calculate BMI.  General Appearance: Casual, Neat, Well Groomed and Obese  Eye Contact:  Good  Speech:  Clear and Coherent and Normal Rate  Volume:  Normal  Mood:  Euthymic  Affect:  Appropriate  Thought Process:  Goal Directed and Descriptions of Associations: Intact  Orientation:  Full (Time, Place, and Person)  Thought Content: Logical   Suicidal Thoughts:  No  Homicidal Thoughts:  No  Memory:  WNL  Judgement:  Good  Insight:  Good  Psychomotor Activity:  Normal  Concentration:  Concentration: Good and Attention Span: Good  Recall:  Good  Fund of Knowledge: Good  Language: Good  Assets:  Desire for Improvement  ADL's:  Intact  Cognition: WNL  Prognosis:  Good     DIAGNOSES:    ICD-10-CM   1. Recurrent major depressive disorder, in partial remission (HCC)  F33.41   2. Encounter for long-term (current) use of medications  Z79.899 Lithium level    TSH    Comprehensive metabolic panel  3. Generalized anxiety disorder  F41.1     Receiving Psychotherapy: Yes  With Elio Forget, [facility].   RECOMMENDATIONS:  PDMP was reviewed. I provided 20 minutes face to face time during this encounter.  Continue Zoloft 100 mg, 1 p.o. daily. Continue Xanax 1 mg, 1/2-1 p.o. twice daily as needed. Continue lithium 600 mg capsules, 2 p.o. nightly. Lab order for CMP,  TSH, and lithium level to be drawn within the next 2 months. Continue therapy with Elio Forget, [facility]. Return in 3 months.  [name], PA-C

## 2019-09-21 ENCOUNTER — Other Ambulatory Visit: Payer: Self-pay

## 2019-09-21 ENCOUNTER — Other Ambulatory Visit: Payer: Self-pay | Admitting: Physician Assistant

## 2019-09-21 ENCOUNTER — Ambulatory Visit (INDEPENDENT_AMBULATORY_CARE_PROVIDER_SITE_OTHER): Payer: No Typology Code available for payment source | Admitting: Mental Health

## 2019-09-21 DIAGNOSIS — F3341 Major depressive disorder, recurrent, in partial remission: Secondary | ICD-10-CM | POA: Diagnosis not present

## 2019-09-21 DIAGNOSIS — F411 Generalized anxiety disorder: Secondary | ICD-10-CM | POA: Diagnosis not present

## 2019-09-21 NOTE — Progress Notes (Signed)
[facility]  Name: [name] Date: 09/21/2019 MRN: [id number] DOB: [birth year] PCP: Ronald Plump, MD  Time spent: 49 minutes  Treatment: individual therapy  Mental Status Exam:   Appearance:   Casual     Behavior:  Appropriate  Motor:  Normal  Speech/Language:   Clear and Coherent  Affect:  Full range  Mood:  euthymic  Thought process:  normal  Thought content:    WNL  Sensory/Perceptual disturbances:    WNL  Orientation:  x4  Attention:  Good  Concentration:  Good  Memory:  WNL  Fund of knowledge:   Good  Insight:    Good  Judgment:   Good  Impulse Control:  Good  ' Risk Assessment: Danger to Self: No Self-injurious Behavior: No Danger to Others: No Duty to Warn:no Physical Aggression / Violence:No  Access to Firearms a concern: No  Gang Involvement:No  Patient / guardian was educated about steps to take if suicide or homicide risk level increases between visits: yes While future psychiatric events cannot be accurately predicted, the patient does not currently require acute inpatient psychiatric care and does not currently meet [facility] involuntary commitment criteria.   Subjective:  Patient presents for session, sharing progress.  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  He stated that over the last few days he has not engaged and often in the course work.  We reviewed SMART goal setting discussed in previous sessions to set both long and short-term goals.  He stated he has utilized previously and plans to reintegrate these concepts toward being consistent with achieving short-term goals.  He shared how he is continuing to struggle with his diet and exercise regimen.  Collaboratively, we identified a way to work that into his schedule to increase some consistency, such as the best time of day to exercise.  He stated he feels he has "too much time to think" referring to how this can allow for  him to feel some feelings of depression.  He reports his depression has been lowered significantly recently as well as his anxiety.  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  Eventually, he plans to engage in full-time work in probably about a month and eventually will get some contract work once he completes these course work.  Facilitated patient in identifying areas with which he would like to continue to make changes as well as self affirming talk related to his taking steps of the last few months toward desiring change.  Interventions: CBT, supportive therapy, solution focused approaches  Diagnoses:  MDD, Recurrent, moderate  Plan: Patient is to use CBT, mindfulness and coping skills to help manage decrease symptoms associated with their diagnosis.  Patient to start coursework next week.  Patient to increase consistency with diet and exercise regimen.  Long-term goal:   Reduce overall level, frequency, and intensity of the feelings of depression, anxiety and panic evidenced by decreased irritability, negative self talk, and helpless feelings from 6 to 7 days/week to 0 to 1 days/week per client report for at least 3 consecutive months.  Short-term goal:  Verbally express understanding of the relationship between feelings of depression, anxiety and their impact on thinking patterns and behaviors. Follow through with exercise regiment toward weight  loss Follow through with reviewing SAT testing signs to secure injury to college Utilize coping skills as discussed in session   Progress: Progressing   [name] Session, [name]

## 2019-09-21 NOTE — Telephone Encounter (Signed)
Next apt 12/12/19

## 2019-10-19 ENCOUNTER — Other Ambulatory Visit: Payer: Self-pay

## 2019-10-19 ENCOUNTER — Ambulatory Visit (INDEPENDENT_AMBULATORY_CARE_PROVIDER_SITE_OTHER): Payer: No Typology Code available for payment source | Admitting: Mental Health

## 2019-10-19 DIAGNOSIS — F411 Generalized anxiety disorder: Secondary | ICD-10-CM

## 2019-10-19 DIAGNOSIS — F3341 Major depressive disorder, recurrent, in partial remission: Secondary | ICD-10-CM | POA: Diagnosis not present

## 2019-10-19 NOTE — Progress Notes (Signed)
[facility]  Name: [name] Date: 10/19/2019 MRN: [id number] DOB: Dec 11, 1995 PCP: Ronald Plump, MD  Time spent: 45 minutes  Treatment: individual therapy   Mental Status Exam:   Appearance:   Casual     Behavior:  Appropriate  Motor:  Normal  Speech/Language:   Clear and Coherent  Affect:  Full range  Mood:  euthymic  Thought process:  normal  Thought content:    WNL  Sensory/Perceptual disturbances:    WNL  Orientation:  x4  Attention:  Good  Concentration:  Good  Memory:  WNL  Fund of knowledge:   Good  Insight:    Good  Judgment:   Good  Impulse Control:  Good  ' Risk Assessment: Danger to Self: No Self-injurious Behavior: No Danger to Others: No Duty to Warn:no Physical Aggression / Violence:No  Access to Firearms a concern: No  Gang Involvement:No  Patient / guardian was educated about steps to take if suicide or homicide risk level increases between visits: yes While future psychiatric events cannot be accurately predicted, the patient does not currently require acute inpatient psychiatric care and does not currently meet [facility] involuntary commitment criteria.   Subjective:  Patient presents for session on time.  Assessed progress.  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  He stated his brother has not been vaccinated and he wants to minimize the potential chance of contracting COVID and possibly giving it to his father.  Patient has concerns about his father's health.  Patient went on to share more regarding the relationship he has with his older brother.  He stated his brother has struggled with substance abuse for many years.  He stated his family in general has a significant history for addiction.  Patient has previously denied any history of addiction for himself and continues to endorse this today.  He verbalizes the challenges  addiction has presented to family members and specifically between him and his brother.  It was identified that there is a significant amount of enabling his family does for his brother which enables his continued use.  Through guided discovery, patient identified "I did not really realize till today how much this may affect me" referring to his brother's long history of substance use and has been normalized by family.  He stated his father and brother have a strained relationship due to his father setting boundaries with his brother.  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  He continues to report problems with focus and attention often distracted and we recommend he discuss with Ronald Overly, NP at our practice regarding treatment options.  He stated he was diagnosed years ago ADHD but his father does not validate mental health in general.  Interventions: CBT, supportive therapy, solution focused approaches  Diagnoses:  MDD, Recurrent, moderate  Plan: Patient is to use CBT, mindfulness and coping skills to help manage decrease symptoms associated with their diagnosis.  Patient to maintain his progress with his course work towards earning his certification.  Patient to increase consistency with diet and exercise regimen.  Long-term goal:   Reduce overall level, frequency, and intensity of the feelings of depression, anxiety and panic evidenced by decreased irritability, negative self talk, and helpless feelings from 6 to 7 days/week to 0 to 1 days/week per client report for at least 3 consecutive months.  Short-term goal:  Verbally  express understanding of the relationship between feelings of depression, anxiety and their impact on thinking patterns and behaviors. Follow through with exercise regiment toward weight loss Follow through with daily goals such as health, obtaining education to give him more employment options Utilize coping skills as discussed in  session   Progress: Progressing   [name] Session, [facility]

## 2019-11-14 ENCOUNTER — Ambulatory Visit: Payer: No Typology Code available for payment source | Admitting: Mental Health

## 2019-11-15 ENCOUNTER — Other Ambulatory Visit: Payer: Self-pay | Admitting: Physician Assistant

## 2019-11-16 NOTE — Telephone Encounter (Signed)
Apt tomorrow 11/05

## 2019-11-17 ENCOUNTER — Ambulatory Visit (INDEPENDENT_AMBULATORY_CARE_PROVIDER_SITE_OTHER): Payer: No Typology Code available for payment source | Admitting: Physician Assistant

## 2019-11-17 ENCOUNTER — Other Ambulatory Visit: Payer: Self-pay

## 2019-11-17 ENCOUNTER — Encounter: Payer: Self-pay | Admitting: Physician Assistant

## 2019-11-17 VITALS — BP 166/98 | HR 88

## 2019-11-17 DIAGNOSIS — E6609 Other obesity due to excess calories: Secondary | ICD-10-CM

## 2019-11-17 DIAGNOSIS — R030 Elevated blood-pressure reading, without diagnosis of hypertension: Secondary | ICD-10-CM

## 2019-11-17 DIAGNOSIS — F411 Generalized anxiety disorder: Secondary | ICD-10-CM

## 2019-11-17 DIAGNOSIS — F3341 Major depressive disorder, recurrent, in partial remission: Secondary | ICD-10-CM | POA: Diagnosis not present

## 2019-11-17 MED ORDER — HYDROXYZINE HCL 10 MG PO TABS: 10.0000 mg | ORAL_TABLET | Freq: Three times a day (TID) | ORAL | 1 refills | Status: DC | PRN

## 2019-11-17 MED ORDER — SERTRALINE HCL 100 MG PO TABS: 100.0000 mg | ORAL_TABLET | Freq: Every day | ORAL | 3 refills | Status: DC

## 2019-11-17 NOTE — Progress Notes (Signed)
[facility]  Patient ID: [name],  MRN: [id number]  PCP: Ronald Plump, MD  Date of Evaluation: 11/17/2019 Time spent:30 minutes  Chief Complaint:  Chief Complaint    Anxiety; Depression      HISTORY/CURRENT STATUS:  He quit taking the Lithium about a month ago.  He forgot it for a few days and then didn't restart it. "I have no idea why. But I'm doing fine only on the Zoloft."  He prefers not to go back on the lithium.  States his mood is good, he is able to enjoy things.  Energy and motivation are good.  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  He is not isolating.  Does not cry easily.  Hygiene is normal.  Denies suicidal or homicidal thoughts.  Wants to discuss ADD.  Was never diagnosed with ADD or ADHD but has felt for a long time that he may have it.  He has a hard time focusing and gets distracted easily.  Feels like it takes him longer to finish a project than it might someone else.  He is not working at a regular job.  Not in school presently either.  The anxiety is well controlled.  Reports not needing the Xanax even once a day sometimes and it is rare that he takes it twice a day now.  He is not having panic attacks like he used to.  When he is anxious it is usually due to some trigger.  Patient denies increased energy with decreased need for sleep, no increased talkativeness, no racing thoughts, no impulsivity or risky behaviors, no increased spending, no increased libido, no grandiosity.  No irritability, no hallucinations.  Denies dizziness, syncope, seizures, numbness, tingling, tremor, tics, unsteady gait, slurred speech, confusion. Denies muscle or joint pain, stiffness, or dystonia.  Individual Medical History/ Review of Systems: Changes? :No    Past medications for mental health diagnoses include: Lithium, BuSpar, Xanax, Seroquel, Risperdal, Depakote, Zoloft  Allergies: Patient has no known allergies.  Current  Medications:  Current Outpatient Medications:    sertraline (ZOLOFT) 100 MG tablet, Take 1 tablet (100 mg total) by mouth daily., Disp: 90 tablet, Rfl: 3   hydrOXYzine (ATARAX/VISTARIL) 10 MG tablet, Take 1-3 tablets (10-30 mg total) by mouth 3 (three) times daily as needed., Disp: 60 tablet, Rfl: 1 Medication Side Effects: none  Family Medical/ Social History: Changes? No.    MENTAL HEALTH EXAM:  Blood pressure (!) 166/98, pulse 88.There is no height or weight on file to calculate BMI.  General Appearance: Casual, Neat, Well Groomed and Obese  Eye Contact:  Good  Speech:  Clear and Coherent and Normal Rate  Volume:  Normal  Mood:  Euthymic  Affect:  Appropriate  Thought Process:  Goal Directed and Descriptions of Associations: Intact  Orientation:  Full (Time, Place, and Person)  Thought Content: Logical   Suicidal Thoughts:  No  Homicidal Thoughts:  No  Memory:  WNL  Judgement:  Good  Insight:  Good  Psychomotor Activity:  Normal  Concentration:  Concentration: Good and Attention Span: Good  Recall:  Good  Fund of Knowledge: Good  Language: Good  Assets:  Desire for Improvement  ADL's:  Intact  Cognition: WNL  Prognosis:  Good     DIAGNOSES:    ICD-10-CM   1. Recurrent major depressive disorder, in partial remission (HCC)  F33.41   2. Generalized anxiety disorder  F41.1   3. Elevated blood pressure reading  R03.0   4. Obesity due to excess calories without serious comorbidity, unspecified classification  E66.09     Receiving Psychotherapy: Yes  With [name], [facility].   RECOMMENDATIONS:  PDMP was reviewed.  He consistently gets the Xanax every 30 days. I provided 30 minutes face to face time during this encounter, including time counseling concerning blood pressure as well as treatment options for ADHD/ADD. We discussed medication compliance.  He should not stop the medication without discussing it with me.  Having said that since he feels fine and does not  want to go back on the lithium right now, I will not push it.  If he starts having suicidal thoughts, we will have to restart it.  He understands and will let me know.  Contract for safety in place.  He knows to call 911, our office, go to the ER, or behavioral health urgent care if he does have suicidal thoughts. We discussed different options for treatment of ADHD.  They include clonidine, guanfacine, Strattera, and stimulant class.  He would prefer to try one of the stimulants as "he is heard good things about them."  He will have to be weaned off Xanax before I will prescribe Xanax.  We did discuss the fact that he reported not needing, or taking, the Xanax as often as is prescribed, but that on the [facility] registry it shows he is having it filled every 30 days.  He then said he might be taking it more often, he could not really remember.  Plus he did not want to run out so he feels that even if he does not need it.  States now he probably has about 20 pills left in the current bottle so he would have taken 40 pills in the past 30 days. Because his blood pressure is extremely high today I recommended that he keep a blood pressure log, take his BP once a day and write it down.  He needs to make an appointment to see his PCP in 1 to 2 weeks or sooner if blood pressures are consistently in the 150/100 or higher.  I will need a note from his PCP stating it will be fine to start a stimulant before I will prescribe 1. He agrees to wean off the Xanax 1 mg by taking 1/2 pill for 7 to 10 days and then stop.  If he has any problems with weaning, has tremor or other side effects after the 10 days, he can call and I will send in 0.25 mg to use for another week of weaning.  He verbalizes understanding and agrees.  I have asked our nurse to call the pharmacy and cancel any refills he has for Xanax. Continue Zoloft 100 mg, 1 p.o. daily. Continue Xanax 1 mg, 1/2-1 p.o. twice daily as needed. Discontinue lithium  which she has already done. Continue therapy with [name], [facility] C. Return in 4 to 6 weeks.  The nurse called his pharmacy to cancel the Xanax refills, approximately 1.5 hours after the patient left our office.  The pharmacist reported that patient picked up a 30-day supply already.  The other refills were canceled. Michal agreed to our plan to wean off the Xanax, yet went straight to the pharmacy after our visit and got the prescription filled right away.  I will discuss this with him at the next visit.  We will discuss other avenues to treat anxiety and ADD, that are not stimulants or BZDs.  [name],  PA-C

## 2019-11-17 NOTE — Patient Instructions (Signed)
On the Xanax 1 mg, take one half a pill daily for 7 to 10 days and then stop.  Call if there are problems in about 10 days and we will add 0.25 mg for about a week and then stop that.

## 2019-12-12 ENCOUNTER — Other Ambulatory Visit: Payer: Self-pay

## 2019-12-12 ENCOUNTER — Ambulatory Visit: Payer: No Typology Code available for payment source | Admitting: Physician Assistant

## 2019-12-12 ENCOUNTER — Ambulatory Visit (INDEPENDENT_AMBULATORY_CARE_PROVIDER_SITE_OTHER): Payer: No Typology Code available for payment source | Admitting: Mental Health

## 2019-12-12 DIAGNOSIS — F3341 Major depressive disorder, recurrent, in partial remission: Secondary | ICD-10-CM

## 2019-12-12 NOTE — Progress Notes (Signed)
[facility]  Name: [name] Date: 12/12/2019 MRN: [id number] DOB: 12-Sep-1995 PCP: Wanda Plump, MD  Time spent: 46 minutes  Treatment: individual therapy   Mental Status Exam:   Appearance:   Casual     Behavior:  Appropriate  Motor:  Normal  Speech/Language:   Clear and Coherent  Affect:  Full range  Mood:  euthymic  Thought process:  normal  Thought content:    WNL  Sensory/Perceptual disturbances:    WNL  Orientation:  x4  Attention:  Good  Concentration:  Good  Memory:  WNL  Fund of knowledge:   Good  Insight:    Good  Judgment:   Good  Impulse Control:  Good  ' Risk Assessment: Danger to Self: No Self-injurious Behavior: No Danger to Others: No Duty to Warn:no Physical Aggression / Violence:No  Access to Firearms a concern: No  Gang Involvement:No  Patient / guardian was educated about steps to take if suicide or homicide risk level increases between visits: yes While future psychiatric events cannot be accurately predicted, the patient does not currently require acute inpatient psychiatric care and does not currently meet [facility] involuntary commitment criteria.   Subjective:  Patient presents for session on time.  He shared progress, how he enjoyed time with family over the Thanksgiving holiday.  He stated that he spoke to his brother recently, how he informed him that he was going to no longer provide him any benzodiazepines as he has done in the past.  He stated that he did this out of concern for his brother but identified how this is a change he needed to make for himself and ultimately feels it will help his brother in the long run.  He stated his brother had a work injury yesterday that was significant and is currently recovering.  He shared his relationship with his father which he stated has continued to be improving.  He reports having problems with procrastination and distractibility related to completing daily tasks,  specifically his taking the online coding course work with which she has expressed wanting to complete.  We discussed ways to cope and improve consistency where he plans to move his computer to another room with less distractions.  He was able to make the connection of how making this and other changes such as setting SMART goals for himself as ways to improve consistency and build motivation.  He plans to follow through between sessions.   Interventions: CBT, supportive therapy, solution focused approaches  Diagnoses:  MDD, Recurrent, moderate  Plan: Patient is to use CBT, mindfulness and coping skills to help manage decrease symptoms associated with their diagnosis.  Patient to maintain his progress with his course work towards earning his certification.  Patient to increase consistency with diet and exercise regimen.  Long-term goal:   Reduce overall level, frequency, and intensity of the feelings of depression, anxiety and panic evidenced by decreased irritability, negative self talk, and helpless feelings from 6 to 7 days/week to 0 to 1 days/week per client report for at least 3 consecutive months.  Short-term goal:  Verbally express understanding of the relationship between feelings of depression, anxiety and their impact on thinking patterns and behaviors. Follow through with exercise regiment toward weight loss Follow through with daily goals such as health, obtaining education to give him more employment options Utilize coping skills as discussed in session   Progress: Progressing   [name] Session, [facility]

## 2019-12-22 ENCOUNTER — Encounter: Payer: Self-pay | Admitting: Internal Medicine

## 2019-12-22 ENCOUNTER — Ambulatory Visit (INDEPENDENT_AMBULATORY_CARE_PROVIDER_SITE_OTHER): Payer: PRIVATE HEALTH INSURANCE | Admitting: Internal Medicine

## 2019-12-22 ENCOUNTER — Other Ambulatory Visit: Payer: Self-pay

## 2019-12-22 VITALS — BP 129/90 | HR 104 | Temp 97.7°F | Ht 71.0 in | Wt 333.6 lb

## 2019-12-22 DIAGNOSIS — Z23 Encounter for immunization: Secondary | ICD-10-CM

## 2019-12-22 DIAGNOSIS — Z0000 Encounter for general adult medical examination without abnormal findings: Secondary | ICD-10-CM

## 2019-12-22 DIAGNOSIS — R739 Hyperglycemia, unspecified: Secondary | ICD-10-CM

## 2019-12-22 NOTE — Patient Instructions (Addendum)
Check the  blood pressure twice a week.  Write them down, bring the log. BP GOAL is between 110/65 and  135/85. If it is consistently higher or lower, let me know   GO TO THE LAB : Get the blood work     GO TO THE FRONT DESK, PLEASE SCHEDULE YOUR APPOINTMENTS Come back for a checkup in 3 months    Safe Sex Practicing safe sex means taking steps before and during sex to reduce your risk of:  Getting an STI (sexually transmitted infection).  Giving your partner an STI.  Unwanted or unplanned pregnancy. How can I practice safe sex?     Ways you can practice safe sex  Limit your sexual partners to only one partner who is having sex with only you.  Avoid using alcohol and drugs before having sex. Alcohol and drugs can affect your judgment.  Before having sex with a new partner: ? Talk to your partner about past partners, past STIs, and drug use. ? Get screened for STIs and discuss the results with your partner. Ask your partner to get screened, too.  Check your body regularly for sores, blisters, rashes, or unusual discharge. If you notice any of these problems, visit your health care provider.  Avoid sexual contact if you have symptoms of an infection or you are being treated for an STI.  While having sex, use a condom. Make sure to: ? Use a condom every time you have vaginal, oral, or anal sex. Both females and males should wear condoms during oral sex. ? Keep condoms in place from the beginning to the end of sexual activity. ? Use a latex condom, if possible. Latex condoms offer the best protection. ? Use only water-based lubricants with a condom. Using petroleum-based lubricants or oils will weaken the condom and increase the chance that it will break. Ways your health care provider can help you practice safe sex  See your health care provider for regular screenings, exams, and tests for STIs.  Talk with your health care provider about what kind of birth control  (contraception) is best for you.  Get vaccinated against hepatitis B and human papillomavirus (HPV).  If you are at risk of being infected with HIV (human immunodeficiency virus), talk with your health care provider about taking a prescription medicine to prevent HIV infection. You are at risk for HIV if you: ? Are a man who has sex with other men. ? Are sexually active with more than one partner. ? Take drugs by injection. ? Have a sex partner who has HIV. ? Have unprotected sex. ? Have sex with someone who has sex with both men and women. ? Have had an STI. Follow these instructions at home:  Take over-the-counter and prescription medicines as told by your health care provider.  Keep all follow-up visits as told by your health care provider. This is important. Where to find more information  Centers for Disease Control and Prevention: LessFurniture.be  Planned Parenthood: https://www.plannedparenthood.org/  Office on Women's Health: EmploymentTracking.tn Summary  Practicing safe sex means taking steps before and during sex to reduce your risk of STIs, giving your partner STIs, and having an unwanted or unplanned pregnancy.  Before having sex with a new partner, talk to your partner about past partners, past STIs, and drug use.  Use a condom every time you have vaginal, oral, or anal sex. Both females and males should wear condoms during oral sex.  Check your body regularly for sores, blisters,  rashes, or unusual discharge. If you notice any of these problems, visit your health care provider.  See your health care provider for regular screenings, exams, and tests for STIs. This information is not intended to replace advice given to you by your health care provider. Make sure you discuss any questions you have with your health care provider. Document Revised: 04/22/2018 Document Reviewed:  10/11/2017 Elsevier Patient Education  2020 [employer].  Testicular Self-Exam A self-examination of your testicles (testicular self-exam) involves looking at and feeling your testicles for abnormal lumps or swelling. Several things can cause swelling, lumps, or pain in your testicles. Some of these causes are:  Injuries.  Inflammation.  Infection.  Buildup of fluids around your testicle (hydrocele).  Twisted testicles (testicular torsion).  Testicular cancer. Why is it important to do a testicular self-exam? Self-examination of the testicles and the left and right groin areas may be recommended if you are at risk for testicular cancer. Your groin is where your lower abdomen meets your upper thighs. You may be at risk for testicular cancer if you have:  An undescended testicle (cryptorchidism).  A history of previous testicular cancer.  A family history of testicular cancer. How to do a testicular self-exam The testicles are easiest to examine after a warm bath or shower. They are more difficult to examine when you are cold. This is because the muscles attached to the testicles retract and pull them up higher or into the abdomen. A normal testicle is egg-shaped and feels firm. It is smooth and not tender. The spermatic cord can be felt as a firm, spaghetti-like cord at the back of your testicle. Look and feel for changes  Stand and hold your penis away from your body.  Look at each testicle to check for lumps or swelling.  Roll each testicle between your thumb and forefinger, feeling the entire testicle. Feel for: ? Lumps. ? Swelling. ? Discomfort.  Check the groin area between your abdomen and upper thighs on both sides of your body. Look and feel for any swelling or bumps that are tender. These could be enlarged lymph nodes. Contact a health care provider if:  You find any bumps or lumps, such as a small, hard, pea-sized lump.  You find swelling, pain, or  soreness.  You see or feel any other changes in your testicles. Summary  A self-examination of your testicles (testicular self-exam) involves looking at and feeling your testicles for any changes.  Self-examination of the testicles and the left and right groin areas may be recommended if you are at risk for testicular cancer.  You should check each of your testicles for lumps, swelling, or discomfort.  You should check for swelling or tender bumps in your groin area between your lower abdomen and upper thighs. This information is not intended to replace advice given to you by your health care provider. Make sure you discuss any questions you have with your health care provider. Document Revised: 04/21/2018 Document Reviewed: 11/25/2015 Elsevier Patient Education  2020 [employer].

## 2019-12-22 NOTE — Progress Notes (Signed)
Subjective:    Patient ID: [name], male    DOB: 07-30-1995, 24 y.o.   MRN: [id number]  DOS:  12/22/2019 Type of visit - description: CPX In general feeling well. His BP was elevated one time when he saw psychiatry: 166/98. Since then, he is doing better with diet, exercise the ambulatory BPs are gradually better but could not tell me any specific readings.   BP Readings from Last 3 Encounters:  12/22/19 129/90  01/10/18 128/68  09/09/17 (!) 144/87    Review of Systems  Other than above, a 14 point review of systems is negative      Past Medical History:  Diagnosis Date  . Hypospadias   . 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 Liver function test abnormality    h/o  . Major depression     was admited to [facility] x 1 week, [name]  is his psychiatrist-- Dx made 01-2007, started on meds then  . Obesity   . Suicidal ideations 2017   cutting behavior    Past Surgical History:  Procedure Laterality Date  . LITHOTRIPSY    . LIVER BIOPSY    . WISDOM TOOTH EXTRACTION      Allergies as of 12/22/2019   No Known Allergies     Medication List       Accurate as of December 22, 2019 11:59 PM. If you have any questions, ask your nurse or doctor.        hydrOXYzine 10 MG tablet Commonly known as: ATARAX/VISTARIL Take 1-3 tablets (10-30 mg total) by mouth 3 (three) times daily as needed.   sertraline 100 MG tablet Commonly known as: ZOLOFT Take 1 tablet (100 mg total) by mouth daily.          Objective:   Physical Exam BP 129/90 (BP Location: Right Arm, Patient Position: Sitting, Cuff Size: Large)   Pulse (!) 104   Temp 97.7 F (36.5 C) (Oral)   Ht 5\' 11"  (1.803 m)   Wt (!) 333 lb 9.6 oz (151.3 kg)   SpO2 96%   BMI 46.53 kg/m  General: Well developed, NAD, BMI noted Neck: No  thyromegaly  HEENT:  Normocephalic . Face symmetric, atraumatic Lungs:  CTA B Normal respiratory effort, no intercostal retractions, no accessory  muscle use. Heart: RRR,  no murmur.  Abdomen:  Not distended, soft, non-tender. No rebound or rigidity.   Lower extremities: no pretibial edema bilaterally  Skin: Exposed areas without rash. Not pale. Not jaundice Neurologic:  alert & oriented X3.  Speech normal, gait appropriate for age and unassisted Strength symmetric and appropriate for age.  Psych: Cognition and judgment appear intact.  Cooperative with normal attention span and concentration.  Behavior appropriate. No anxious or depressed appearing.     Assessment      Assessment: Major depression  -- was admited to [facility] x 1 week,  sees psychiatriy-- Dx made 01-2007, was rx meds then Morbid obesity H/o Hypospadias H/o hypogonadism, pituitary insufficiency, used to see Dr. 08-30-2005 or [name]; Testost wnl 2016 NASH: H/o increase LFTs, saw GI 2014 >> Bx + for NASH, rx wt loss , vit e 800 u Urolithiasis --x4; 2 at age 67, lithotrypsy 73-2015  PLAN: Here for CPX Elevated BP: BP was elevated one time at psychiatry, 166/98, in the context of the patient not doing well with diet and exercise 5 months prior. Since then for the last 3 weeks he is  doing better, has lost weight approximately 20 pounds in his own scale. We are doing general labs, paying attention to potassium levels, anemia, increase hemoglobin, renal fx. Recommend to monitor BPs, write them down, come back in 3 months for reassessment. Major depression: Seems well controlled. Morbid obesity: Doing better with lifestyle.  See above RTC 3 months   this visit occurred during the SARS-CoV-2 public health emergency.  Safety protocols were in place, including screening questions prior to the visit, additional usage of staff PPE, and extensive cleaning of exam room while observing appropriate contact time as indicated for disinfecting solutions.

## 2019-12-23 ENCOUNTER — Encounter: Payer: Self-pay | Admitting: Internal Medicine

## 2019-12-23 NOTE — Assessment & Plan Note (Signed)
Here for CPX Elevated BP: BP was elevated one time at psychiatry, 166/98, in the context of the patient not doing well with diet and exercise 5 months prior. Since then for the last 3 weeks he is doing better, has lost weight approximately 20 pounds in his own scale. We are doing general labs, paying attention to potassium levels, anemia, increase hemoglobin, renal fx. Recommend to monitor BPs, write them down, come back in 3 months for reassessment. Major depression: Seems well controlled. Morbid obesity: Doing better with lifestyle.  See above RTC 3 months

## 2019-12-23 NOTE — Assessment & Plan Note (Signed)
Td 2017 Polio, HPV, Meningitis , Bexero: completed  covid vax x 2: rec booster Flu shot today Labs:CMP, FLP, CBC, TSH, HIV, a1c Diet and exercise: Safe sex, STE discussed

## 2019-12-25 ENCOUNTER — Other Ambulatory Visit: Payer: Self-pay

## 2019-12-25 ENCOUNTER — Encounter: Payer: Self-pay | Admitting: Physician Assistant

## 2019-12-25 ENCOUNTER — Ambulatory Visit (INDEPENDENT_AMBULATORY_CARE_PROVIDER_SITE_OTHER): Payer: No Typology Code available for payment source | Admitting: Physician Assistant

## 2019-12-25 VITALS — BP 163/95 | HR 98

## 2019-12-25 DIAGNOSIS — F3341 Major depressive disorder, recurrent, in partial remission: Secondary | ICD-10-CM | POA: Diagnosis not present

## 2019-12-25 DIAGNOSIS — E6609 Other obesity due to excess calories: Secondary | ICD-10-CM | POA: Diagnosis not present

## 2019-12-25 DIAGNOSIS — F411 Generalized anxiety disorder: Secondary | ICD-10-CM | POA: Diagnosis not present

## 2019-12-25 LAB — CBC WITH DIFFERENTIAL/PLATELET
Absolute Monocytes: 878 cells/uL (ref 200–950)
Basophils Absolute: 31 cells/uL (ref 0–200)
Basophils Relative: 0.4 %
Eosinophils Absolute: 131 cells/uL (ref 15–500)
Eosinophils Relative: 1.7 %
HCT: 45.9 % (ref 38.5–50.0)
Hemoglobin: 15.4 g/dL (ref 13.2–17.1)
Lymphs Abs: 2849 cells/uL (ref 850–3900)
MCH: 29.4 pg (ref 27.0–33.0)
MCHC: 33.6 g/dL (ref 32.0–36.0)
MCV: 87.6 fL (ref 80.0–100.0)
MPV: 9 fL (ref 7.5–12.5)
Monocytes Relative: 11.4 %
Neutro Abs: 3812 cells/uL (ref 1500–7800)
Neutrophils Relative %: 49.5 %
Platelets: 261 10*3/uL (ref 140–400)
RBC: 5.24 10*6/uL (ref 4.20–5.80)
RDW: 12.8 % (ref 11.0–15.0)
Total Lymphocyte: 37 %
WBC: 7.7 10*3/uL (ref 3.8–10.8)

## 2019-12-25 LAB — HEMOGLOBIN A1C
Hgb A1c MFr Bld: 5.3 % of total Hgb (ref <5.7)
Mean Plasma Glucose: 105 mg/dL
eAG (mmol/L): 5.8 mmol/L

## 2019-12-25 LAB — COMPREHENSIVE METABOLIC PANEL
AG Ratio: 1.7 (calc) (ref 1.0–2.5)
ALT: 49 U/L — ABNORMAL HIGH (ref 9–46)
AST: 30 U/L (ref 10–40)
Albumin: 4.5 g/dL (ref 3.6–5.1)
Alkaline phosphatase (APISO): 52 U/L (ref 36–130)
BUN: 12 mg/dL (ref 7–25)
CO2: 24 mmol/L (ref 20–32)
Calcium: 9.4 mg/dL (ref 8.6–10.3)
Chloride: 101 mmol/L (ref 98–110)
Creat: 0.74 mg/dL (ref 0.60–1.35)
Globulin: 2.6 g/dL (calc) (ref 1.9–3.7)
Glucose, Bld: 83 mg/dL (ref 65–99)
Potassium: 4.3 mmol/L (ref 3.5–5.3)
Sodium: 136 mmol/L (ref 135–146)
Total Bilirubin: 0.6 mg/dL (ref 0.2–1.2)
Total Protein: 7.1 g/dL (ref 6.1–8.1)

## 2019-12-25 LAB — LIPID PANEL
Cholesterol: 261 mg/dL — ABNORMAL HIGH (ref <200)
HDL: 69 mg/dL (ref >=40)
LDL Cholesterol (Calc): 172 mg/dL (calc) — ABNORMAL HIGH
Non-HDL Cholesterol (Calc): 192 mg/dL (calc) — ABNORMAL HIGH (ref <130)
Total CHOL/HDL Ratio: 3.8 (calc) (ref <5.0)
Triglycerides: 89 mg/dL (ref <150)

## 2019-12-25 LAB — TSH: TSH: 1.11 mIU/L (ref 0.40–4.50)

## 2019-12-25 LAB — HIV ANTIBODY (ROUTINE TESTING W REFLEX): HIV 1&2 Ab, 4th Generation: NONREACTIVE

## 2019-12-25 MED ORDER — CLONIDINE HCL ER 0.1 MG PO TB12: 0.1000 mg | ORAL_TABLET | Freq: Every day | ORAL | 1 refills | Status: DC

## 2019-12-25 NOTE — Progress Notes (Signed)
[facility]  Patient ID: [name],  MRN: [id number]  PCP: Wanda Plump, MD  Date of Evaluation: 12/25/2019 Time spent:30 minutes  Chief Complaint:  Chief Complaint    ADD; Anxiety; Depression      HISTORY/CURRENT STATUS:  He saw PCP 130/90-127/87 PCP just told him to watch his blood pressures.  Patient went from here straight to his pharmacy to get Xanax at the last visit.  We discussed that and briefly touched on his brothers habit.  Apparently his brother has problems with drugs, not exactly sure what kind, but patient alluded to the fact that he has been giving him some of his medication.  States he is not going to do that anymore and does not really need the Xanax.  He does have some situational anxiety at times but nothing severe as in the past.  States his mood is good, he is able to enjoy things.  Energy and motivation are good.  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  He is not isolating.  Does not cry easily.  Hygiene is normal.  Denies suicidal or homicidal thoughts.  At the last visit, we discussed the possibility of ADD/ADHD.  He still states he has a hard time focusing and finishing things.  Feels like it takes him longer to finish a project than it might someone else.  He is not working at a regular job but hopes to start working soon.  He wants to be able to focus and be at his best with what ever work he finds.  Not in school presently.   Patient denies increased energy with decreased need for sleep, no increased talkativeness, no racing thoughts, no impulsivity or risky behaviors, no increased spending, no increased libido, no grandiosity.  No irritability, no paranoia, and no hallucinations.  Denies dizziness, syncope, seizures, numbness, tingling, tremor, tics, unsteady gait, slurred speech, confusion. Denies muscle or joint pain, stiffness, or dystonia.  Individual Medical History/ Review of Systems: Changes? :No     Past medications for mental health diagnoses include: Lithium, BuSpar, Xanax, Seroquel, Risperdal, Depakote, Zoloft  Allergies: Patient has no known allergies.  Current Medications:  Current Outpatient Medications:  .  hydrOXYzine (ATARAX/VISTARIL) 10 MG tablet, Take 1-3 tablets (10-30 mg total) by mouth 3 (three) times daily as needed., Disp: 60 tablet, Rfl: 1 .  sertraline (ZOLOFT) 100 MG tablet, Take 1 tablet (100 mg total) by mouth daily., Disp: 90 tablet, Rfl: 3 .  cloNIDine HCl (KAPVAY) 0.1 MG TB12 ER tablet, Take 1 tablet (0.1 mg total) by mouth at bedtime., Disp: 30 tablet, Rfl: 1 Medication Side Effects: none  Family Medical/ Social History: Changes? No.    MENTAL HEALTH EXAM:  Blood pressure (!) 163/95, pulse 98.There is no height or weight on file to calculate BMI.  General Appearance: Casual, Neat, Well Groomed and Obese  Eye Contact:  Good  Speech:  Clear and Coherent and Normal Rate  Volume:  Normal  Mood:  Euthymic  Affect:  Appropriate  Thought Process:  Goal Directed and Descriptions of Associations: Intact  Orientation:  Full (Time, Place, and Person)  Thought Content: Logical   Suicidal Thoughts:  No  Homicidal Thoughts:  No  Memory:  WNL  Judgement:  Good  Insight:  Good  Psychomotor Activity:  Normal  Concentration:  Concentration: Good and Attention Span: Good  Recall:  Good  Fund of Knowledge: Good  Language: Good  Assets:  Desire for Improvement  ADL's:  Intact  Cognition: WNL  Prognosis:  Good     DIAGNOSES:    ICD-10-CM   1. Recurrent major depressive disorder, in partial remission (HCC)  F33.41   2. Generalized anxiety disorder  F41.1   3. Obesity due to excess calories without serious comorbidity, unspecified classification  E66.09     Receiving Psychotherapy: Yes  With Elio Forget, [facility].   RECOMMENDATIONS:  PDMP was reviewed.   I provided 30 minutes of face-to-face time during this encounter, most of which was concerning  misuse of medications in general but especially controlled substances.  Also time spent charting and reviewing notes by Elio Forget, [facility] C.  He understands that I am not going to prescribe benzos anymore.  No stimulants.  He is not working plus I am concerned about his brother with misuse of controlled substances although I am not certain he has a problem with stimulants. Hydroxyzine is recommended for situational anxiety.  He would like to try it.  Sedation precautions were given. We discussed nonstimulant options options for treatment of ADHD.  They include clonidine, guanfacine, Strattera.  I recommend either Kapvay or Intuniv because of their blood pressure control properties as well as helping with focus.  We discussed the benefits, risk and side effects on clonidine and agreed to start that. Start Kapvay 0.1 mg every evening. Continue Zoloft 100 mg, 1 p.o. daily. Discontinue Xanax, which he has already done. Start hydroxyzine 10 mg, 1-3 p.o. 3 times daily as needed. Continue therapy with Elio Forget, [facility] C. Return in 4 to 6 weeks.   [name], PA-C

## 2020-01-16 ENCOUNTER — Ambulatory Visit (INDEPENDENT_AMBULATORY_CARE_PROVIDER_SITE_OTHER): Payer: No Typology Code available for payment source | Admitting: Mental Health

## 2020-01-16 ENCOUNTER — Other Ambulatory Visit: Payer: Self-pay

## 2020-01-16 ENCOUNTER — Other Ambulatory Visit: Payer: Self-pay | Admitting: Physician Assistant

## 2020-01-16 DIAGNOSIS — F3341 Major depressive disorder, recurrent, in partial remission: Secondary | ICD-10-CM | POA: Diagnosis not present

## 2020-01-16 DIAGNOSIS — F411 Generalized anxiety disorder: Secondary | ICD-10-CM | POA: Diagnosis not present

## 2020-01-16 NOTE — Progress Notes (Signed)
[facility]  Name: [name] Date: 01/16/2020 MRN: [id number] DOB: Sep 05, 1995 PCP: Wanda Plump, MD  Time spent: 46 minutes  Treatment: individual therapy   Mental Status Exam:   Appearance:   Casual     Behavior:  Appropriate  Motor:  Normal  Speech/Language:   Clear and Coherent  Affect:  Full range  Mood:  euthymic  Thought process:  normal  Thought content:    WNL  Sensory/Perceptual disturbances:    WNL  Orientation:  x4  Attention:  Good  Concentration:  Good  Memory:  WNL  Fund of knowledge:   Good  Insight:    Good  Judgment:   Good  Impulse Control:  Good  ' Risk Assessment: Danger to Self: No Self-injurious Behavior: No Danger to Others: No Duty to Warn:no Physical Aggression / Violence:No  Access to Firearms a concern: No  Gang Involvement:No  Patient / guardian was educated about steps to take if suicide or homicide risk level increases between visits: yes While future psychiatric events cannot be accurately predicted, the patient does not currently require acute inpatient psychiatric care and does not currently meet [facility] involuntary commitment criteria.   Subjective:  Patient presents for session on time.  Reports he's currently depressed has been for the past 2 weeks.  He stated he is struggling with low motivation, sleeping about 8 hours a night but then lying in bed for another 5 or 6 hours due to the low motivation, reporting feelings of hopelessness.  He had difficulty articulating specific thoughts associated "everything just kind just do not mind" he did eventually state that he tends to worry and ruminate at night when trying to get to sleep, worries about his health, family.  He had difficulty giving specifics in these areas.  We encouraged him to identify 3 journaling thoughts specifically about his health family etc. he feels perpetuate is feeling more depressed and anxious.  He is struggle with motivation recently  and has not engaged in his online computer learning course work.  He stated for the last 2 weeks he has struggled.  He stated that he did apply strategies discussed in previous sessions with organizing and scheduling his day.  He plans to return to this scheduling and we discussed the 5-second role to start behaviors.    Interventions: CBT, supportive therapy, solution focused approaches  Diagnoses:  MDD, Recurrent, moderate  Plan: Patient is to use CBT, mindfulness and coping skills to help manage decrease symptoms associated with their diagnosis.  Patient to maintain his progress with his course work towards earning his certification.  Patient to increase consistency with diet and exercise regimen.  Long-term goal:   Reduce overall level, frequency, and intensity of the feelings of depression, anxiety and panic evidenced by decreased irritability, negative self talk, and helpless feelings from 6 to 7 days/week to 0 to 1 days/week per client report for at least 3 consecutive months.  Short-term goal:  Verbally express understanding of the relationship between feelings of depression, anxiety and their impact on thinking patterns and behaviors. Follow through with exercise regiment toward weight loss Follow through with daily goals such as health, obtaining education to give him more employment options Utilize coping skills as discussed in session   Progress: Progressing   [name] Session, [facility]

## 2020-01-17 ENCOUNTER — Other Ambulatory Visit: Payer: Self-pay | Admitting: Physician Assistant

## 2020-01-18 ENCOUNTER — Telehealth: Payer: Self-pay | Admitting: Physician Assistant

## 2020-01-18 NOTE — Telephone Encounter (Signed)
[name] needs you to call him at [phone] about the message he sent to [employer] and [place] forwarded to you.

## 2020-01-19 NOTE — Telephone Encounter (Signed)
Please let him know to increase the Zoloft to 150 mg p.o. daily if he asks about a benzo, that is denied.  If the anxiety is a huge problem and I will send in hydroxyzine or decide on another medication to help.

## 2020-01-24 ENCOUNTER — Other Ambulatory Visit: Payer: Self-pay | Admitting: Physician Assistant

## 2020-01-24 MED ORDER — HYDROXYZINE HCL 10 MG PO TABS: 10.0000 mg | ORAL_TABLET | Freq: Three times a day (TID) | ORAL | 1 refills | Status: DC | PRN

## 2020-01-24 NOTE — Telephone Encounter (Signed)
Called to follow up with patient and he decided he would like the hydroxyzine sent to his pharmacy, [facility] [address]. Should be on his profile. He has a follow up apt on 01/28, he did not think he needed more Zoloft yet due to increase.

## 2020-01-24 NOTE — Telephone Encounter (Signed)
Prescription was sent

## 2020-02-09 ENCOUNTER — Other Ambulatory Visit: Payer: Self-pay

## 2020-02-09 ENCOUNTER — Encounter: Payer: Self-pay | Admitting: Physician Assistant

## 2020-02-09 ENCOUNTER — Ambulatory Visit (INDEPENDENT_AMBULATORY_CARE_PROVIDER_SITE_OTHER): Payer: No Typology Code available for payment source | Admitting: Physician Assistant

## 2020-02-09 VITALS — BP 163/107 | HR 114

## 2020-02-09 DIAGNOSIS — F331 Major depressive disorder, recurrent, moderate: Secondary | ICD-10-CM

## 2020-02-09 DIAGNOSIS — R030 Elevated blood-pressure reading, without diagnosis of hypertension: Secondary | ICD-10-CM

## 2020-02-09 DIAGNOSIS — F900 Attention-deficit hyperactivity disorder, predominantly inattentive type: Secondary | ICD-10-CM

## 2020-02-09 DIAGNOSIS — F411 Generalized anxiety disorder: Secondary | ICD-10-CM

## 2020-02-09 DIAGNOSIS — E6609 Other obesity due to excess calories: Secondary | ICD-10-CM | POA: Diagnosis not present

## 2020-02-09 MED ORDER — GABAPENTIN 100 MG PO CAPS: 100.0000 mg | ORAL_CAPSULE | Freq: Four times a day (QID) | ORAL | 1 refills | Status: DC | PRN

## 2020-02-09 MED ORDER — SERTRALINE HCL 100 MG PO TABS: 150.0000 mg | ORAL_TABLET | Freq: Every day | ORAL | 1 refills | Status: DC

## 2020-02-09 NOTE — Progress Notes (Signed)
[facility]  Patient ID: [name],  MRN: [id number]  PCP: Wanda Plump, MD  Date of Evaluation: 02/09/2020 Time spent:30 minutes  Chief Complaint:  Chief Complaint    Anxiety; Depression; ADD      HISTORY/CURRENT STATUS:  Still having a lot of anxiety.  Would like a few Xanax if possible.  "Maybe 15 or 20 or so.  I do not need 60 like I did in the past."  The hydroxyzine only helps a little but causes too much sedation and he is not able to do things that he wants to because he is too sleepy.  Describes generalized anxiety, feeling on edge and nervous.  No panic attacks no chest pain, palpitations or shortness of breath.  No dizziness.  Since the last visit we increased the Zoloft approximately 3 weeks ago.  Reports that he feels a little better.  He wants to do things more.  Energy and motivation are a little bit better.  Not isolating like he did.  Still not working or going to school.  Denies suicidal or homicidal thoughts.  We started Kapvay at the last visit.  He thinks he is able to focus a little better than he did off of it.  States he is checking his blood pressure at home with his cuff and his father's cuff and it is running high normal, meaning 140s over 90s or so.  Patient denies increased energy with decreased need for sleep, no increased talkativeness, no racing thoughts, no impulsivity or risky behaviors, no increased spending, no increased libido, no grandiosity.  No irritability, no paranoia, and no hallucinations.  Denies dizziness, syncope, seizures, numbness, tingling, tremor, tics, unsteady gait, slurred speech, confusion. Denies muscle or joint pain, stiffness, or dystonia.  Individual Medical History/ Review of Systems: Changes? :No    Past medications for mental health diagnoses include: Lithium, BuSpar, Xanax, Seroquel, Risperdal, Depakote, Zoloft  Allergies: Patient has no known allergies.  Current Medications:  Current Outpatient  Medications:  .  cloNIDine HCl (KAPVAY) 0.1 MG TB12 ER tablet, TAKE 1 TABLET BY MOUTH AT BEDTIME., Disp: 90 tablet, Rfl: 0 .  gabapentin (NEURONTIN) 100 MG capsule, Take 1 capsule (100 mg total) by mouth 4 (four) times daily as needed., Disp: 60 capsule, Rfl: 1 .  hydrOXYzine (ATARAX/VISTARIL) 10 MG tablet, Take 1-3 tablets (10-30 mg total) by mouth 3 (three) times daily as needed., Disp: 60 tablet, Rfl: 1 .  sertraline (ZOLOFT) 100 MG tablet, Take 1.5 tablets (150 mg total) by mouth daily., Disp: 135 tablet, Rfl: 1 Medication Side Effects: none  Family Medical/ Social History: Changes? No.    MENTAL HEALTH EXAM:  Blood pressure (!) 163/107, pulse (!) 114.There is no height or weight on file to calculate BMI.  General Appearance: Casual, Neat, Well Groomed and Obese  Eye Contact:  Good  Speech:  Clear and Coherent and Normal Rate  Volume:  Normal  Mood:  Euthymic  Affect:  Appropriate  Thought Process:  Goal Directed and Descriptions of Associations: Intact  Orientation:  Full (Time, Place, and Person)  Thought Content: Logical   Suicidal Thoughts:  No  Homicidal Thoughts:  No  Memory:  WNL  Judgement:  Good  Insight:  Good  Psychomotor Activity:  Normal  Concentration:  Concentration: Good and Attention Span: Good  Recall:  Good  Fund of Knowledge: Good  Language: Good  Assets:  Desire for Improvement  ADL's:  Intact  Cognition: WNL  Prognosis:  Good  DIAGNOSES:    ICD-10-CM   1. Major depressive disorder, recurrent episode, moderate (HCC)  F33.1   2. Generalized anxiety disorder  F41.1   3. Attention deficit hyperactivity disorder (ADHD), predominantly inattentive type  F90.0   4. Obesity due to excess calories without serious comorbidity, unspecified classification  E66.09   5. Elevated blood pressure reading  R03.0     Receiving Psychotherapy: Yes  With Elio Forget, [facility].   RECOMMENDATIONS:  PDMP was reviewed.   I provided 30 minutes of face-to-face time  during this encounter, reviewing the [facility] and previous counseling notes.  Also discussing the anxiety and the fact that it can be treated in other ways besides benzodiazepines.  I will not give him benzos or stimulants, with the benzos, he went straight to the pharmacy several visits ago, even though he and I had discussed minutes before how to wean off of the Xanax and he stated that he had plenty of Xanax in order to wean off.  I brought that up to him again and he apologized, saying he must have misunderstood, but at any rate he was sorry.  We agreed to add gabapentin.  Benefits, risks and side effects were discussed and he would like to try the gabapentin.  Another option would be to retry BuSpar.  He took that years ago but does not remember if it was effective or not.  He understands that treating the depression with Zoloft can also help prevent and treat the anxiety, as well as his attention. As far as his blood pressure goes, he should keep a log of it using his and his dad's machine, take that to his PCPs office.  He needs to see them within the next week or so for that.  He understands. Discontinue hydroxyzine. Start gabapentin 100 mg, 1 p.o. 4 times daily as needed anxiety. Continue clonidine ER 0.1 mg, 1 p.o. nightly. Continue Zoloft 100 mg, 1.5 pills daily. Continue therapy with Elio Forget, [facility] C. Return in 4 to 6 weeks.  [name], PA-C

## 2020-02-13 ENCOUNTER — Other Ambulatory Visit: Payer: Self-pay

## 2020-02-13 ENCOUNTER — Ambulatory Visit (INDEPENDENT_AMBULATORY_CARE_PROVIDER_SITE_OTHER): Payer: No Typology Code available for payment source | Admitting: Mental Health

## 2020-02-13 DIAGNOSIS — F331 Major depressive disorder, recurrent, moderate: Secondary | ICD-10-CM | POA: Diagnosis not present

## 2020-02-13 NOTE — Progress Notes (Signed)
[facility]  Name: [name] Date: 02/13/2020 MRN: [id number] DOB: [birth year] PCP: Wanda Plump, MD  Time spent: 50 minutes  Treatment: individual therapy   Mental Status Exam:   Appearance:   Casual     Behavior:  Appropriate  Motor:  Normal  Speech/Language:   Clear and Coherent  Affect:  Full range  Mood:  euthymic  Thought process:  normal  Thought content:    WNL  Sensory/Perceptual disturbances:    WNL  Orientation:  x4  Attention:  Good  Concentration:  Good  Memory:  WNL  Fund of knowledge:   Good  Insight:    Good  Judgment:   Good  Impulse Control:  Good  ' Risk Assessment: Danger to Self: No Self-injurious Behavior: No Danger to Others: No Duty to Warn:no Physical Aggression / Violence:No  Access to Firearms a concern: No  Gang Involvement:No  Patient / guardian was educated about steps to take if suicide or homicide risk level increases between visits: yes While future psychiatric events cannot be accurately predicted, the patient does not currently require acute inpatient psychiatric care and does not currently meet [facility] involuntary commitment criteria.   Subjective:  Patient presents for session on time.  Patient shared progress, how he does follow through between sessions in terms of deciding what career path to focus on.  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  He stated he has recently made this decision and plans to follow through by daily and weekly scheduling to get more reconnected to his coding learning curriculum.  He stated he is followed through by writing down goals for the year and has it in a place where he can see it daily.  He continues to report some anxiety, through [employer], identified "I think I fear taking chances".  Facilitated his further exploring needs  and he identified needing to be more social planning to reach out to a few friends that he has not spoken to in a while.   Interventions: CBT, supportive therapy, solution focused approaches  Diagnoses:  MDD, Recurrent, moderate  Plan: Patient is to use CBT, mindfulness and coping skills to help manage decrease symptoms associated with their diagnosis.  Patient to maintain his progress with his course work towards earning his certification.  Patient to increase consistency with diet and exercise regimen.  Long-term goal:   Reduce overall level, frequency, and intensity of the feelings of depression, anxiety and panic evidenced by decreased irritability, negative self talk, and helpless feelings from 6 to 7 days/week to 0 to 1 days/week per client report for at least 3 consecutive months.  Short-term goal:  Verbally express understanding of the relationship between feelings of depression, anxiety and their impact on thinking patterns and behaviors. Follow through with exercise regiment toward weight loss Follow through with daily goals such as health, obtaining education to give him more employment options Utilize coping skills as discussed in session   Progress: Progressing   [name] Session, [facility]

## 2020-03-12 ENCOUNTER — Ambulatory Visit: Payer: No Typology Code available for payment source | Admitting: Mental Health

## 2020-03-22 ENCOUNTER — Ambulatory Visit: Payer: PRIVATE HEALTH INSURANCE | Admitting: Internal Medicine

## 2020-04-03 ENCOUNTER — Ambulatory Visit: Payer: No Typology Code available for payment source | Admitting: Physician Assistant

## 2020-04-22 ENCOUNTER — Other Ambulatory Visit: Payer: Self-pay

## 2020-04-22 ENCOUNTER — Encounter: Payer: Self-pay | Admitting: Internal Medicine

## 2020-04-22 ENCOUNTER — Ambulatory Visit (INDEPENDENT_AMBULATORY_CARE_PROVIDER_SITE_OTHER): Payer: Self-pay | Admitting: Internal Medicine

## 2020-04-22 VITALS — BP 148/102 | HR 82 | Temp 98.0°F | Resp 18 | Ht 71.0 in | Wt 327.5 lb

## 2020-04-22 DIAGNOSIS — I10 Essential (primary) hypertension: Secondary | ICD-10-CM

## 2020-04-22 NOTE — Progress Notes (Signed)
Subjective:    Patient ID: [name], male    DOB: 07-Jul-1995, 25 y.o.   MRN: [id number]  DOS:  04/22/2020 Type of visit - description: Follow-up Since the last office visit, has lost some weight. Ambulatory BPs reviewed Reports some anxiety.  BP Readings from Last 3 Encounters:  04/22/20 (!) 148/102  12/22/19 129/90  01/10/18 128/68   Wt Readings from Last 3 Encounters:  04/22/20 (!) 327 lb 8 oz (148.6 kg)  12/22/19 (!) 333 lb 9.6 oz (151.3 kg)  01/10/18 (!) 322 lb 8 oz (146.3 kg)     Review of Systems See above   Past Medical History:  Diagnosis Date  . Hypospadias   . 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 Liver function test abnormality    h/o  . Major depression     was admited to [facility] x 1 week, [name]  is his psychiatrist-- Dx made 01-2007, started on meds then  . Obesity   . Suicidal ideations 2017   cutting behavior    Past Surgical History:  Procedure Laterality Date  . LITHOTRIPSY    . LIVER BIOPSY    . WISDOM TOOTH EXTRACTION      Allergies as of 04/22/2020   No Known Allergies     Medication List       Accurate as of April 22, 2020 10:50 AM. If you have any questions, ask your nurse or doctor.        cloNIDine HCl 0.1 MG Tb12 ER tablet Commonly known as: KAPVAY TAKE 1 TABLET BY MOUTH AT BEDTIME.   gabapentin 100 MG capsule Commonly known as: NEURONTIN Take 1 capsule (100 mg total) by mouth 4 (four) times daily as needed.   hydrOXYzine 10 MG tablet Commonly known as: ATARAX/VISTARIL Take 1-3 tablets (10-30 mg total) by mouth 3 (three) times daily as needed.   sertraline 100 MG tablet Commonly known as: ZOLOFT Take 1.5 tablets (150 mg total) by mouth daily.          Objective:   Physical Exam BP (!) 148/102 (BP Location: Left Arm, Patient Position: Sitting, Cuff Size: Normal)   Pulse 82   Temp 98 F (36.7 C) (Oral)   Resp 18   Ht 5\' 11"  (1.803 m)   Wt (!) 327 lb 8 oz (148.6 kg)   SpO2 98%   BMI  45.68 kg/m  General:   Well developed, NAD, BMI noted.  HEENT:  Normocephalic . Face symmetric, atraumatic Lungs:  CTA B Normal respiratory effort, no intercostal retractions, no accessory muscle use. Heart: RRR,  no murmur.  Abdomen:  Not distended, soft, non-tender. No rebound or rigidity.  No bruit Skin: Not pale. Not jaundice Lower extremities: no pretibial edema bilaterally  Neurologic:  alert & oriented X3.  Speech normal, gait appropriate for age and unassisted Psych--  Cognition and judgment appear intact.  Cooperative with normal attention span and concentration.  Behavior appropriate. No anxious or depressed appearing.     Assessment      Assessment: Major depression  -- was admited to [facility] x 1 week,  sees psychiatriy-- Dx made 01-2007, was rx meds then Morbid obesity H/o Hypospadias H/o hypogonadism, pituitary insufficiency, used to see Dr. 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  Denies any suicidal ideas, recommend to continue working with his therapist and [name] (who rx  Medications). HTN: See last visit, labs showed no anemia, normal potassium-creatinine-TSH.   Epworth scale score 3, negative. Ambulatory BPs range from 120/80 to 140/90. For now will continue monitoring, stressed the need to continue losing weight.  Reassess in 6 months.  Low-salt diet information provided encouraged. At some point will consider secondary hypertension work-up. Morbid obesity: Doing better RTC 6 months   This visit occurred during the SARS-CoV-2 public health emergency.  Safety protocols were in place, including screening questions prior to the visit, additional usage of staff PPE,  and extensive cleaning of exam room while observing appropriate contact time as indicated for disinfecting solutions.

## 2020-04-22 NOTE — Patient Instructions (Addendum)
Check the  blood pressure 2 times per week BP GOAL is between 110/65 and  135/85. If it is consistently higher or lower, let me know  Low-salt diet  Please continue see your counselor and Rosey [facility]   GO TO THE FRONT DESK, PLEASE SCHEDULE YOUR APPOINTMENTS Come back for a checkup in 6 months      DASH Eating Plan DASH stands for Dietary Approaches to Stop Hypertension. The DASH eating plan is a healthy eating plan that has been shown to:  Reduce high blood pressure (hypertension).  Reduce your risk for type 2 diabetes, heart disease, and stroke.  Help with weight loss. What are tips for following this plan? Reading food labels  Check food labels for the amount of salt (sodium) per serving. Choose foods with less than 5 percent of the Daily Value of sodium. Generally, foods with less than 300 milligrams (mg) of sodium per serving fit into this eating plan.  To find whole grains, look for the word "whole" as the first word in the ingredient list. Shopping  Buy products labeled as "low-sodium" or "no salt added."  Buy fresh foods. Avoid canned foods and pre-made or frozen meals. Cooking  Avoid adding salt when cooking. Use salt-free seasonings or herbs instead of table salt or sea salt. Check with your health care provider or pharmacist before using salt substitutes.  Do not fry foods. Cook foods using healthy methods such as baking, boiling, grilling, roasting, and broiling instead.  Cook with heart-healthy oils, such as olive, canola, avocado, soybean, or sunflower oil. Meal planning  Eat a balanced diet that includes: ? 4 or more servings of fruits and 4 or more servings of vegetables each day. Try to fill one-half of your plate with fruits and vegetables. ? 6-8 servings of whole grains each day. ? Less than 6 oz (170 g) of lean meat, poultry, or fish each day. A 3-oz (85-g) serving of meat is about the same size as a deck of cards. One egg equals 1 oz (28 g). ? 2-3  servings of low-fat dairy each day. One serving is 1 cup (237 mL). ? 1 serving of nuts, seeds, or beans 5 times each week. ? 2-3 servings of heart-healthy fats. Healthy fats called omega-3 fatty acids are found in foods such as walnuts, flaxseeds, fortified milks, and eggs. These fats are also found in cold-water fish, such as sardines, salmon, and mackerel.  Limit how much you eat of: ? Canned or prepackaged foods. ? Food that is high in trans fat, such as some fried foods. ? Food that is high in saturated fat, such as fatty meat. ? Desserts and other sweets, sugary drinks, and other foods with added sugar. ? Full-fat dairy products.  Do not salt foods before eating.  Do not eat more than 4 egg yolks a week.  Try to eat at least 2 vegetarian meals a week.  Eat more home-cooked food and less restaurant, buffet, and fast food.   Lifestyle  When eating at a restaurant, ask that your food be prepared with less salt or no salt, if possible.  If you drink alcohol: ? Limit how much you use to:  0-1 drink a day for women who are not pregnant.  0-2 drinks a day for men. ? Be aware of how much alcohol is in your drink. In the [place] one drink equals one 12 oz bottle of beer (355 mL), one 5 oz glass of wine (148 mL), or one 1  oz glass of hard liquor (44 mL). General information  Avoid eating more than 2,300 mg of salt a day. If you have hypertension, you may need to reduce your sodium intake to 1,500 mg a day.  Work with your health care provider to maintain a healthy body weight or to lose weight. Ask what an ideal weight is for you.  Get at least 30 minutes of exercise that causes your heart to beat faster (aerobic exercise) most days of the week. Activities may include walking, swimming, or biking.  Work with your health care provider or dietitian to adjust your eating plan to your individual calorie needs. What foods should I eat? Fruits All fresh, dried, or frozen fruit. Canned  fruit in natural juice (without added sugar). Vegetables Fresh or frozen vegetables (raw, steamed, roasted, or grilled). Low-sodium or reduced-sodium tomato and vegetable juice. Low-sodium or reduced-sodium tomato sauce and tomato paste. Low-sodium or reduced-sodium canned vegetables. Grains Whole-grain or whole-wheat bread. Whole-grain or whole-wheat pasta. Brown rice. Orpah Cobb. Bulgur. Whole-grain and low-sodium cereals. Pita bread. Low-fat, low-sodium crackers. Whole-wheat flour tortillas. Meats and other proteins Skinless chicken or [place]. Ground chicken or [place]. Pork with fat trimmed off. Fish and seafood. Egg whites. Dried beans, peas, or lentils. Unsalted nuts, nut butters, and seeds. Unsalted canned beans. Lean cuts of beef with fat trimmed off. Low-sodium, lean precooked or cured meat, such as sausages or meat loaves. Dairy Low-fat (1%) or fat-free (skim) milk. Reduced-fat, low-fat, or fat-free cheeses. Nonfat, low-sodium ricotta or cottage cheese. Low-fat or nonfat yogurt. Low-fat, low-sodium cheese. Fats and oils Soft margarine without trans fats. Vegetable oil. Reduced-fat, low-fat, or light mayonnaise and salad dressings (reduced-sodium). Canola, safflower, olive, avocado, soybean, and sunflower oils. Avocado. Seasonings and condiments Herbs. Spices. Seasoning mixes without salt. Other foods Unsalted popcorn and pretzels. Fat-free sweets. The items listed above may not be a complete list of foods and beverages you can eat. Contact a dietitian for more information. What foods should I avoid? Fruits Canned fruit in a light or heavy syrup. Fried fruit. Fruit in cream or butter sauce. Vegetables Creamed or fried vegetables. Vegetables in a cheese sauce. Regular canned vegetables (not low-sodium or reduced-sodium). Regular canned tomato sauce and paste (not low-sodium or reduced-sodium). Regular tomato and vegetable juice (not low-sodium or reduced-sodium). Rosita Fire.  Olives. Grains Baked goods made with fat, such as croissants, muffins, or some breads. Dry pasta or rice meal packs. Meats and other proteins Fatty cuts of meat. Ribs. Fried meat. Tomasa Blase. Bologna, salami, and other precooked or cured meats, such as sausages or meat loaves. Fat from the back of a pig (fatback). Bratwurst. Salted nuts and seeds. Canned beans with added salt. Canned or smoked fish. Whole eggs or egg yolks. Chicken or [place] with skin. Dairy Whole or 2% milk, cream, and half-and-half. Whole or full-fat cream cheese. Whole-fat or sweetened yogurt. Full-fat cheese. Nondairy creamers. Whipped toppings. Processed cheese and cheese spreads. Fats and oils Butter. Stick margarine. Lard. Shortening. Ghee. Bacon fat. Tropical oils, such as coconut, palm kernel, or palm oil. Seasonings and condiments Onion salt, garlic salt, seasoned salt, table salt, and sea salt. Worcestershire sauce. Tartar sauce. Barbecue sauce. Teriyaki sauce. Soy sauce, including reduced-sodium. Steak sauce. Canned and packaged gravies. Fish sauce. Oyster sauce. Cocktail sauce. Store-bought horseradish. Ketchup. Mustard. Meat flavorings and tenderizers. Bouillon cubes. Hot sauces. Pre-made or packaged marinades. Pre-made or packaged taco seasonings. Relishes. Regular salad dressings. Other foods Salted popcorn and pretzels. The items listed above may not be a  complete list of foods and beverages you should avoid. Contact a dietitian for more information. Where to find more information  National Heart, Lung, and Blood Institute: PopSteam.is  American Heart Association: www.heart.org  Academy of Nutrition and Dietetics: www.eatright.org  National Kidney Foundation: www.kidney.org Summary  The DASH eating plan is a healthy eating plan that has been shown to reduce high blood pressure (hypertension). It may also reduce your risk for type 2 diabetes, heart disease, and stroke.  When on the DASH eating plan, aim to  eat more fresh fruits and vegetables, whole grains, lean proteins, low-fat dairy, and heart-healthy fats.  With the DASH eating plan, you should limit salt (sodium) intake to 2,300 mg a day. If you have hypertension, you may need to reduce your sodium intake to 1,500 mg a day.  Work with your health care provider or dietitian to adjust your eating plan to your individual calorie needs. This information is not intended to replace advice given to you by your health care provider. Make sure you discuss any questions you have with your health care provider. Document Revised: 12/02/2018 Document Reviewed: 12/02/2018 Elsevier Patient Education  2021 [employer].

## 2020-04-23 DIAGNOSIS — I10 Essential (primary) hypertension: Secondary | ICD-10-CM | POA: Insufficient documentation

## 2020-04-23 NOTE — Assessment & Plan Note (Signed)
Depression anxiety: Follow-up by the behavioral team, currently only taking sertraline, self discontinue Atarax, gabapentin, and long acting clonidine.  Denies any suicidal ideas, recommend to continue working with his therapist and [name] (who rx  Medications). HTN: See last visit, labs showed no anemia, normal potassium-creatinine-TSH.   Epworth scale score 3, negative. Ambulatory BPs range from 120/80 to 140/90. For now will continue monitoring, stressed the need to continue losing weight.  Reassess in 6 months.  Low-salt diet information provided encouraged. At some point will consider secondary hypertension work-up. Morbid obesity: Doing better RTC 6 months

## 2020-05-28 ENCOUNTER — Telehealth: Payer: Self-pay | Admitting: Physician Assistant

## 2020-05-28 ENCOUNTER — Other Ambulatory Visit: Payer: Self-pay

## 2020-05-28 MED ORDER — SERTRALINE HCL 100 MG PO TABS: 150.0000 mg | ORAL_TABLET | Freq: Every day | ORAL | 0 refills | Status: DC

## 2020-05-28 NOTE — Telephone Encounter (Signed)
Noted  

## 2020-05-28 NOTE — Telephone Encounter (Signed)
Rx sent 

## 2020-05-28 NOTE — Telephone Encounter (Signed)
[name] called to report that he has changed pharmacies.  Remove [facility] [address] and replace it with [facility] on [address]  He also needs a refill of his zoloft.  Appt 06/11/20

## 2020-06-11 ENCOUNTER — Ambulatory Visit (INDEPENDENT_AMBULATORY_CARE_PROVIDER_SITE_OTHER): Payer: 59 | Admitting: Physician Assistant

## 2020-06-11 ENCOUNTER — Other Ambulatory Visit: Payer: Self-pay

## 2020-06-11 ENCOUNTER — Encounter: Payer: Self-pay | Admitting: Physician Assistant

## 2020-06-11 VITALS — BP 152/111 | HR 99 | Ht 72.0 in | Wt 320.0 lb

## 2020-06-11 DIAGNOSIS — F411 Generalized anxiety disorder: Secondary | ICD-10-CM | POA: Diagnosis not present

## 2020-06-11 DIAGNOSIS — R030 Elevated blood-pressure reading, without diagnosis of hypertension: Secondary | ICD-10-CM

## 2020-06-11 DIAGNOSIS — F410 Panic disorder [episodic paroxysmal anxiety] without agoraphobia: Secondary | ICD-10-CM

## 2020-06-11 DIAGNOSIS — E6609 Other obesity due to excess calories: Secondary | ICD-10-CM

## 2020-06-11 DIAGNOSIS — F331 Major depressive disorder, recurrent, moderate: Secondary | ICD-10-CM | POA: Diagnosis not present

## 2020-06-11 DIAGNOSIS — F900 Attention-deficit hyperactivity disorder, predominantly inattentive type: Secondary | ICD-10-CM | POA: Diagnosis not present

## 2020-06-11 DIAGNOSIS — G4700 Insomnia, unspecified: Secondary | ICD-10-CM

## 2020-06-11 MED ORDER — SERTRALINE HCL 100 MG PO TABS: 200.0000 mg | ORAL_TABLET | Freq: Every day | ORAL | 0 refills | Status: DC

## 2020-06-11 MED ORDER — PROPRANOLOL HCL 20 MG PO TABS: 20.0000 mg | ORAL_TABLET | Freq: Three times a day (TID) | ORAL | 1 refills | Status: DC | PRN

## 2020-06-11 MED ORDER — GABAPENTIN 100 MG PO CAPS: 200.0000 mg | ORAL_CAPSULE | Freq: Two times a day (BID) | ORAL | 0 refills | Status: DC

## 2020-06-11 NOTE — Progress Notes (Signed)
[facility]  Patient ID: [name],  MRN: [id number]  PCP: Wanda Plump, MD  Date of Evaluation: 06/11/2020 Time spent:40 minutes  Chief Complaint:  Chief Complaint    Anxiety; Depression; ADD; Insomnia; Follow-up      HISTORY/CURRENT STATUS: HPI For routine med check.  States he couldn't remember whether he was supposed to stay on the Clonidine or not, so hasn't taken it since the end of Jan. Also he changed pharmacies and prescriptions got messed up, and he was confused. He only took the Gabapentin a few time. Thought it was supposed to be as needed only and it didn't help. Also took Hydroxyzine a few times and it made him drowsy but wasn't helpful.  States he had depression when he started the clonidine so he did not take it anymore.  Cont to have anxiety. Generalized anxiety is daily. No PA but getting 'close' to having one. Those episodes can last 2-3 hours. Racing thoughts and obsessions. Will have palpitations and chest tightness, sweating.   Trouble staying asleep about 3 nights per wk. Wakes up in about 2 hours and can't go back to sleep for a few hours.   Not really depressed, but feels down b/c of the chronic anxiety. Doesn't have interest in doing things, lays down a lot, not working, has been looking for a job, but nothing has panned out. Energy and motivation are low. Isolating. Personal hygiene is not good. Can go 2-3 wks before showering. Doesn't have active SI but feels like it wouldn't matter if he wasn't here. No plan. No HI.   Patient denies increased energy with decreased need for sleep, no increased talkativeness, no racing thoughts, no impulsivity or risky behaviors, no increased spending, no increased libido, no grandiosity, no increased irritability or anger, and no hallucinations.  Individual Medical History/ Review of Systems: Changes? :No   Past medications for mental health diagnoses include: Lithium, BuSpar, Xanax, Seroquel, Risperdal,  Depakote, Zoloft, Clonidine,   Allergies: Patient has no known allergies.  Current Medications:  Current Outpatient Medications:  .  propranolol (INDERAL) 20 MG tablet, Take 1 tablet (20 mg total) by mouth 3 (three) times daily as needed., Disp: 60 tablet, Rfl: 1 .  gabapentin (NEURONTIN) 100 MG capsule, Take 2 capsules (200 mg total) by mouth 2 (two) times daily., Disp: 120 capsule, Rfl: 0 .  hydrOXYzine (ATARAX/VISTARIL) 10 MG tablet, Take 1-3 tablets (10-30 mg total) by mouth 3 (three) times daily as needed. (Patient not taking: No sig reported), Disp: 60 tablet, Rfl: 1 .  sertraline (ZOLOFT) 100 MG tablet, Take 2 tablets (200 mg total) by mouth daily., Disp: 180 tablet, Rfl: 0 Medication Side Effects: none  Family Medical/ Social History: Changes? No  MENTAL HEALTH EXAM:  Blood pressure (!) 152/111, pulse 99, height 6' (1.829 m), weight (!) 320 lb (145.2 kg).Body mass index is 43.4 kg/m.  General Appearance: Casual, Well Groomed and Obese  Eye Contact:  Fair  Speech:  Clear and Coherent and Normal Rate  Volume:  Normal  Mood:  Anxious and Depressed  Affect:  Depressed and Anxious  Thought Process:  Goal Directed and Descriptions of Associations: Circumstantial  Orientation:  Full (Time, Place, and Person)  Thought Content: Logical   Suicidal Thoughts:  Yes.  without intent/plan  Homicidal Thoughts:  No  Memory:  WNL  Judgement:  Good  Insight:  Good  Psychomotor Activity:  Restlessness  Concentration:  Concentration: Fair  Recall:  Good  Fund of Knowledge: Good  Language: Good  Assets:  Desire for Improvement  ADL's:  Intact  Cognition: WNL  Prognosis:  Good    DIAGNOSES:    ICD-10-CM   1. Major depressive disorder, recurrent episode, moderate (HCC)  F33.1   2. Generalized anxiety disorder  F41.1   3. Panic disorder  F41.0   4. Attention deficit hyperactivity disorder (ADHD), predominantly inattentive type  F90.0   5. Elevated blood pressure reading  R03.0   6.  Obesity due to excess calories without serious comorbidity, unspecified classification  E66.09   7. Insomnia, unspecified type  G47.00     Receiving Psychotherapy: No Had been seeing Elio Forget, [facility].  Has not seen him in a while.   RECOMMENDATIONS:  PDMP was reviewed. I provided 40 minutes of face to face time during this encounter, including time spent before and after the visit in records review, medical decision making, and charting.  We discussed the diagnosis and treatment options.  Stressed medication compliance.  If there are any questions concerning directions of how to take the medication, he should call our office for instructions. Discussed the passive suicidal thoughts.  He knows to call our office, 911, the suicide hotline, or go behavioral health urgent care or [facility], ER if he starts having suicidal thoughts with a plan or the passive thoughts get worse. Recommend adding propranolol to help with anxiety and physical symptoms related to the near panic attacks.  Benefits, risks, and side effects were discussed and he accepts.  He understands if he starts to feel faint or dizzy he should call.  His blood pressure has been running mildly high anyway so this could help with that as well as the anxiety.  He needs to see his PCP concerning elevated blood pressure. Cut out all caffeine.  Sleep hygiene was discussed. Start gabapentin 200 mg p.o. twice daily. Continue hydroxyzine 10 mg, 1 to 3 pills 3 times daily as needed.  Can take this at night to help with sleep. Start propranolol 20 mg, 1 p.o. 3 times daily as needed anxiety. Increase Zoloft 100 mg to 2 p.o. daily. Discontinue clonidine due to side effects. Get back in to see Elio Forget, [facility] as soon as he can. Return in 2 months.  [name], PA-C

## 2020-08-06 ENCOUNTER — Other Ambulatory Visit: Payer: Self-pay

## 2020-08-06 ENCOUNTER — Ambulatory Visit (INDEPENDENT_AMBULATORY_CARE_PROVIDER_SITE_OTHER): Payer: 59 | Admitting: Physician Assistant

## 2020-08-06 ENCOUNTER — Encounter: Payer: Self-pay | Admitting: Physician Assistant

## 2020-08-06 VITALS — BP 138/94 | HR 78

## 2020-08-06 DIAGNOSIS — E6609 Other obesity due to excess calories: Secondary | ICD-10-CM | POA: Diagnosis not present

## 2020-08-06 DIAGNOSIS — F3341 Major depressive disorder, recurrent, in partial remission: Secondary | ICD-10-CM

## 2020-08-06 DIAGNOSIS — F900 Attention-deficit hyperactivity disorder, predominantly inattentive type: Secondary | ICD-10-CM | POA: Diagnosis not present

## 2020-08-06 DIAGNOSIS — G4700 Insomnia, unspecified: Secondary | ICD-10-CM

## 2020-08-06 DIAGNOSIS — F411 Generalized anxiety disorder: Secondary | ICD-10-CM

## 2020-08-06 MED ORDER — PROPRANOLOL HCL 20 MG PO TABS: 20.0000 mg | ORAL_TABLET | Freq: Three times a day (TID) | ORAL | 2 refills | Status: DC | PRN

## 2020-08-06 MED ORDER — GABAPENTIN 100 MG PO CAPS: 200.0000 mg | ORAL_CAPSULE | Freq: Two times a day (BID) | ORAL | 2 refills | Status: DC

## 2020-08-06 MED ORDER — ATOMOXETINE HCL 25 MG PO CAPS: 25.0000 mg | ORAL_CAPSULE | Freq: Every morning | ORAL | 1 refills | Status: DC

## 2020-08-06 MED ORDER — SERTRALINE HCL 100 MG PO TABS: 200.0000 mg | ORAL_TABLET | Freq: Every day | ORAL | 0 refills | Status: DC

## 2020-08-06 NOTE — Progress Notes (Signed)
[facility]  Patient ID: [name],  MRN: [id number]  PCP: Wanda Plump, MD  Date of Evaluation: 08/06/2020 Time spent:30 minutes  Chief Complaint:  Chief Complaint   Follow-up; Depression; Anxiety     HISTORY/CURRENT STATUS: HPI For routine med check.  Anxiety hasn't been as bad, since we increased the Zoloft in May.  After 2-3 weeks of the increase, he started feeling better with the anxiety.  Not having panic attacks like he used to.  He does have generalized anxiety and sometimes is triggered by something and will feel nervous, with a sense of unease like something bad may happen. He does have palpitations and gets sweaty if he is super anxious and the propranolol helps with that.  Has not taken the hydroxyzine very much, it makes him sleepy so if he needs it in the evening he will take it so he can relax and go to sleep.  Complains of not being able to focus and get things done.  He is currently learning a new computer program and hopes to get a job in the future using this skill.  He cannot stay on task because of distractions. He was never diagnosed with ADD or ADHD as a kid, stating that his dad would not have let him be on any meds for treatment anyway.  Patient denies loss of interest in usual activities and is able to enjoy things.  Denies decreased energy or motivation.  Appetite has not changed.  No extreme sadness, tearfulness, or feelings of hopelessness. Denies suicidal or homicidal thoughts.  Patient denies increased energy with decreased need for sleep, no increased talkativeness, no racing thoughts, no impulsivity or risky behaviors, no increased spending, no increased libido, no grandiosity, no increased irritability or anger, and no hallucinations.  Denies dizziness, syncope, seizures, numbness, tingling, tremor, tics, unsteady gait, slurred speech, confusion. Denies muscle or joint pain, stiffness, or dystonia.  Individual Medical History/ Review  of Systems: Changes? :No   Past medications for mental health diagnoses include: Lithium, BuSpar, Xanax, Seroquel, Risperdal, Depakote, Zoloft, Clonidine caused fatigue and increased depression.   Allergies: Patient has no known allergies.  Current Medications:  Current Outpatient Medications:    atomoxetine (STRATTERA) 25 MG capsule, Take 1 capsule (25 mg total) by mouth in the morning., Disp: 30 capsule, Rfl: 1   hydrOXYzine (ATARAX/VISTARIL) 10 MG tablet, Take 1-3 tablets (10-30 mg total) by mouth 3 (three) times daily as needed., Disp: 60 tablet, Rfl: 1   gabapentin (NEURONTIN) 100 MG capsule, Take 2 capsules (200 mg total) by mouth 2 (two) times daily., Disp: 120 capsule, Rfl: 2   propranolol (INDERAL) 20 MG tablet, Take 1 tablet (20 mg total) by mouth 3 (three) times daily as needed., Disp: 60 tablet, Rfl: 2   sertraline (ZOLOFT) 100 MG tablet, Take 2 tablets (200 mg total) by mouth daily., Disp: 180 tablet, Rfl: 0 Medication Side Effects: none  Family Medical/ Social History: Changes? No  MENTAL HEALTH EXAM:  Blood pressure (!) 138/94, pulse 78.There is no height or weight on file to calculate BMI.  General Appearance: Casual, Well Groomed and Obese  Eye Contact:  Good  Speech:  Clear and Coherent and Normal Rate  Volume:  Normal  Mood:  Euthymic  Affect:  Congruent  Thought Process:  Goal Directed and Descriptions of Associations: Circumstantial  Orientation:  Full (Time, Place, and Person)  Thought Content: Logical   Suicidal Thoughts:  No  Homicidal Thoughts:  No  Memory:  WNL  Judgement:  Good  Insight:  Good  Psychomotor Activity:  Normal  Concentration:  Concentration: Fair and Attention Span: Fair  Recall:  Good  Fund of Knowledge: Good  Language: Good  Assets:  Desire for Improvement  ADL's:  Intact  Cognition: WNL  Prognosis:  Good    DIAGNOSES:    ICD-10-CM   1. Attention deficit hyperactivity disorder (ADHD), predominantly inattentive type  F90.0      2. Generalized anxiety disorder  F41.1     3. Recurrent major depressive disorder, in partial remission (HCC)  F33.41     4. Obesity due to excess calories without serious comorbidity, unspecified classification  E66.09     5. Insomnia, unspecified type  G47.00        Receiving Psychotherapy: No Had been seeing Elio Forget, [facility].  Has not seen him in a while.   RECOMMENDATIONS:  PDMP was reviewed. Last Xanax 11/16/2020. I provided 30 minutes of face to face time during this encounter, including time spent before and after the visit in records review, medical decision making, and charting.  We discussed the ADD and treatment options.  He did not tolerate clonidine so that is not an option.  I want to avoid stimulants for several reasons, most of all his high normal blood pressure.  Recommend Strattera.  Benefits, risks and side effects were discussed.  He accepts. Start Strattera 25 mg, 1 p.o. every morning. Continue gabapentin 200 mg p.o. twice daily. Continue hydroxyzine 10 mg, 1-3 p.o. 3 times daily as needed. Continue propranolol 20 mg, 1 p.o. 3 times daily. Continue Zoloft 100 mg, 2 p.o. nightly. Recommend he get back in therapy. Return in 2 months.  [name], PA-C

## 2020-10-05 ENCOUNTER — Other Ambulatory Visit: Payer: Self-pay | Admitting: Physician Assistant

## 2020-10-07 NOTE — Telephone Encounter (Signed)
Apt tomorrow

## 2020-10-08 ENCOUNTER — Encounter: Payer: Self-pay | Admitting: Physician Assistant

## 2020-10-08 ENCOUNTER — Other Ambulatory Visit: Payer: Self-pay

## 2020-10-08 ENCOUNTER — Ambulatory Visit (INDEPENDENT_AMBULATORY_CARE_PROVIDER_SITE_OTHER): Payer: 59 | Admitting: Physician Assistant

## 2020-10-08 DIAGNOSIS — G4700 Insomnia, unspecified: Secondary | ICD-10-CM

## 2020-10-08 DIAGNOSIS — F900 Attention-deficit hyperactivity disorder, predominantly inattentive type: Secondary | ICD-10-CM | POA: Diagnosis not present

## 2020-10-08 DIAGNOSIS — F3341 Major depressive disorder, recurrent, in partial remission: Secondary | ICD-10-CM | POA: Diagnosis not present

## 2020-10-08 DIAGNOSIS — F411 Generalized anxiety disorder: Secondary | ICD-10-CM | POA: Diagnosis not present

## 2020-10-08 MED ORDER — TRAZODONE HCL 100 MG PO TABS: 100.0000 mg | ORAL_TABLET | Freq: Every evening | ORAL | 1 refills | Status: DC | PRN

## 2020-10-08 MED ORDER — ATOMOXETINE HCL 40 MG PO CAPS
40.0000 mg | ORAL_CAPSULE | Freq: Every day | ORAL | 1 refills | Status: DC
Start: 2020-10-08 — End: 2020-11-13

## 2020-10-08 NOTE — Progress Notes (Signed)
[facility]  Patient ID: [name],  MRN: [id number]  PCP: Wanda Plump, MD  Date of Evaluation: 10/08/2020 Time spent:30 minutes  Chief Complaint:  Chief Complaint   Anxiety; Depression; Insomnia; Follow-up     HISTORY/CURRENT STATUS: HPI For routine med check.  Not sleeping well for the past few weeks. Thinks too much, thinking about things he needs/wants to do. Takes awhile to go to sleep and then often wakes up after an hour or so and can't go back to sleep. Last week, only got 2-3 hours, a night, then this week so far about 6 hours per night. Had been drinking caffeine later in the day than usual. Also stays on devices more in the evening. Trying not to do that. Tried the hydroxyzine 30-40 mg and it didn't help.  Does not remember her dreams, no nightmares.  Added Strattera at LOV, not able to tell any improvement. Not working. Getting a new car this week and will look for something then. Not having a car was one reason he wouldn't go for any jobs interviews. Hard time focusing. Doing a computer course now for technology and can't stay on task very well.    Patient denies loss of interest in usual activities and is able to enjoy things.  Denies decreased energy or motivation.  Appetite has not changed, eating less but wants to eat healthier.  No extreme sadness, tearfulness, or feelings of hopelessness.  Not having anxiety like he used to.  Not needing the hydroxyzine for anxiety.  He does sometimes have the ruminating thoughts, especially in the evenings that are part of the problem with sleep, but no panic attacks with palpitations, sweats, shortness of breath or other physical symptoms.  Denies suicidal or homicidal thoughts.  Patient denies increased energy with decreased need for sleep, no increased talkativeness, no racing thoughts, no impulsivity or risky behaviors, no increased spending, no increased libido, no grandiosity, no increased irritability or anger,  and no hallucinations.  Denies dizziness, syncope, seizures, numbness, tingling, tremor, tics, unsteady gait, slurred speech, confusion. Denies muscle or joint pain, stiffness, or dystonia.   Individual Medical History/ Review of Systems: Changes? :No    Past medications for mental health diagnoses include: Lithium, BuSpar, Xanax, Seroquel, Risperdal, Depakote, Zoloft, Clonidine caused fatigue and increased depression.   Allergies: Patient has no known allergies.  Current Medications:  Current Outpatient Medications:    atomoxetine (STRATTERA) 40 MG capsule, Take 1 capsule (40 mg total) by mouth daily., Disp: 30 capsule, Rfl: 1   gabapentin (NEURONTIN) 100 MG capsule, Take 2 capsules (200 mg total) by mouth 2 (two) times daily., Disp: 120 capsule, Rfl: 2   hydrOXYzine (ATARAX/VISTARIL) 10 MG tablet, Take 1-3 tablets (10-30 mg total) by mouth 3 (three) times daily as needed., Disp: 60 tablet, Rfl: 1   propranolol (INDERAL) 20 MG tablet, Take 1 tablet (20 mg total) by mouth 3 (three) times daily as needed., Disp: 60 tablet, Rfl: 2   sertraline (ZOLOFT) 100 MG tablet, Take 2 tablets (200 mg total) by mouth daily., Disp: 180 tablet, Rfl: 0   traZODone (DESYREL) 100 MG tablet, Take 1-2 tablets (100-200 mg total) by mouth at bedtime as needed for sleep., Disp: 60 tablet, Rfl: 1 Medication Side Effects: none  Family Medical/ Social History: Changes? No  MENTAL HEALTH EXAM:  There were no vitals taken for this visit.There is no height or weight on file to calculate BMI.  Unable to obtain blood pressure due to his large  size arms.   General Appearance: Casual, Well Groomed and Obese  Eye Contact:  Good  Speech:  Clear and Coherent and Normal Rate  Volume:  Normal  Mood:  Euthymic  Affect:  Congruent  Thought Process:  logical   Orientation:  Full (Time, Place, and Person)  Thought Content: Logical   Suicidal Thoughts:  No  Homicidal Thoughts:  No  Memory:  WNL  Judgement:  Good   Insight:  Good  Psychomotor Activity:  Normal  Concentration:  Concentration: Fair and Attention Span: Fair  Recall:  Good  Fund of Knowledge: Good  Language: Good  Assets:  Desire for Improvement  ADL's:  Intact  Cognition: WNL  Prognosis:  Good    DIAGNOSES:    ICD-10-CM   1. Attention deficit hyperactivity disorder (ADHD), predominantly inattentive type  F90.0     2. Insomnia, unspecified type  G47.00     3. Recurrent major depressive disorder, in partial remission (HCC)  F33.41     4. Generalized anxiety disorder  F41.1         Receiving Psychotherapy: No Had been seeing Elio Forget, [facility].  Has not seen him in a while.   RECOMMENDATIONS:  PDMP was reviewed.  Last Xanax filled 11/17/2019.  No other controlled substances. I provided 30 minutes of face to face time during this encounter, including time spent before and after the visit in records review, medical decision making, counseling pertinent to today's visit, and charting.  Discussed sleep hygiene, including no caffeine after 12 noon, no electronic devices within 2 hours of the time he needs to go to sleep or wear blue blocker glasses. Recommend adding trazodone to help with sleep.  Benefits, risks and side effects were discussed and he accepts. Start trazodone 100 mg, 1-2 nightly as needed sleep.  Recommend taking it for approximately 4 nights in a row starting tonight to help reset the circadian rhythm.  Then after that take as needed.  If he has daytime sedation, can decrease to 1/2 pill nightly as needed. Increase Strattera to 40 mg, if no improvement in 3-4 weeks, he can call and will increase to 60 or 80mg . If can see me in 4 wks, we can discuss then.  Continue gabapentin 200 mg p.o. twice daily. Continue hydroxyzine 10 mg, 1-3 p.o. 3 times daily as needed. Continue propranolol 20 mg, 1 p.o. 3 times daily. Continue Zoloft 100 mg, 2 p.o. nightly. Recommend he get back in therapy. Return in 4-6  weeks.  , PA-C

## 2020-10-22 ENCOUNTER — Encounter: Payer: Self-pay | Admitting: Internal Medicine

## 2020-10-22 ENCOUNTER — Ambulatory Visit: Payer: 59 | Admitting: Internal Medicine

## 2020-10-22 ENCOUNTER — Other Ambulatory Visit: Payer: Self-pay

## 2020-10-22 VITALS — BP 122/76 | HR 121 | Temp 98.3°F | Resp 16 | Ht 72.0 in | Wt 328.1 lb

## 2020-10-22 DIAGNOSIS — K7581 Nonalcoholic steatohepatitis (NASH): Secondary | ICD-10-CM

## 2020-10-22 DIAGNOSIS — R739 Hyperglycemia, unspecified: Secondary | ICD-10-CM | POA: Diagnosis not present

## 2020-10-22 DIAGNOSIS — Z23 Encounter for immunization: Secondary | ICD-10-CM

## 2020-10-22 DIAGNOSIS — E785 Hyperlipidemia, unspecified: Secondary | ICD-10-CM

## 2020-10-22 DIAGNOSIS — I10 Essential (primary) hypertension: Secondary | ICD-10-CM | POA: Diagnosis not present

## 2020-10-22 LAB — HEPATIC FUNCTION PANEL
ALT: 53 U/L (ref 0–53)
AST: 30 U/L (ref 0–37)
Albumin: 4.3 g/dL (ref 3.5–5.2)
Alkaline Phosphatase: 44 U/L (ref 39–117)
Bilirubin, Direct: 0.1 mg/dL (ref 0.0–0.3)
Total Bilirubin: 0.5 mg/dL (ref 0.2–1.2)
Total Protein: 6.7 g/dL (ref 6.0–8.3)

## 2020-10-22 LAB — LIPID PANEL
Cholesterol: 259 mg/dL — ABNORMAL HIGH (ref 0–200)
HDL: 65 mg/dL (ref >39.00)
LDL Cholesterol: 178 mg/dL — ABNORMAL HIGH (ref 0–99)
NonHDL: 193.89
Total CHOL/HDL Ratio: 4
Triglycerides: 77 mg/dL (ref 0.0–149.0)
VLDL: 15.4 mg/dL (ref 0.0–40.0)

## 2020-10-22 MED ORDER — PROPRANOLOL HCL 20 MG PO TABS: 20.0000 mg | ORAL_TABLET | Freq: Three times a day (TID) | ORAL | 12 refills | Status: DC | PRN

## 2020-10-22 MED ORDER — VITAMIN E 180 MG (400 UNIT) PO CAPS: 800.0000 [IU] | ORAL_CAPSULE | Freq: Every day | ORAL | Status: DC

## 2020-10-22 NOTE — Progress Notes (Signed)
Subjective:    Patient ID: [name], male    DOB: 01-09-1996, 25 y.o.   MRN: [id number]  DOS:  10/22/2020 Type of visit - description: f/u Today with talk about high blood pressure, high cholesterol and [place].  Since the last visit is doing well, was checking BPs regularly and they were better. Trying to eat low-salt. Heart rate was noted to be elevated, denies chest pain, difficulty breathing or palpitations.  Not taking Inderal regularly.     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  is his psychiatrist-- Dx made 01-2007, started on meds then   Obesity    Suicidal ideations 2017   cutting behavior    Past Surgical History:  Procedure Laterality Date   LITHOTRIPSY     LIVER BIOPSY     WISDOM TOOTH EXTRACTION      Allergies as of 10/22/2020   No Known Allergies      Medication List        Accurate as of October 22, 2020  9:38 AM. If you have any questions, ask your nurse or doctor.          atomoxetine 40 MG capsule Commonly known as: Strattera Take 1 capsule (40 mg total) by mouth daily.   gabapentin 100 MG capsule Commonly known as: NEURONTIN Take 2 capsules (200 mg total) by mouth 2 (two) times daily.   hydrOXYzine 10 MG tablet Commonly known as: ATARAX/VISTARIL Take 1-3 tablets (10-30 mg total) by mouth 3 (three) times daily as needed.   propranolol 20 MG tablet Commonly known as: INDERAL Take 1 tablet (20 mg total) by mouth 3 (three) times daily as needed.   sertraline 100 MG  tablet Commonly known as: ZOLOFT Take 2 tablets (200 mg total) by mouth daily.   traZODone 100 MG tablet Commonly known as: DESYREL Take 1-2 tablets (100-200 mg total) by mouth at bedtime as needed for sleep.           Objective:   Physical Exam BP 122/76 (BP Location: Left Arm, Patient Position: Sitting, Cuff Size: Normal)   Pulse (!) 121   Temp 98.3 F (36.8 C) (Oral)   Resp 16   Ht 6' (1.829 m)   Wt (!) 328 lb 2 oz (148.8 kg)   SpO2 98%   BMI 44.50 kg/m  General:   Well developed, NAD, BMI noted. HEENT:  Normocephalic . Face symmetric, atraumatic Lungs:  CTA B Normal respiratory effort, no intercostal retractions, no accessory muscle use. Heart: Tachycardic, regular,  no murmur.  Lower extremities: no pretibial edema bilaterally  Skin: Not pale. Not jaundice Neurologic:  alert & oriented X3.  Speech normal, gait appropriate for age and unassisted Psych--  Cognition and judgment appear intact.  Cooperative with normal attention span and concentration.  Behavior appropriate. No anxious or depressed appearing.      Assessment  Assessment: Major depression  -- was admited to [facility] x 1 week,  sees psychiatriy-- Dx made 01-2007, was rx meds then Morbid obesity H/o Hypospadias H/o hypogonadism, pituitary insufficiency, used to see Dr. 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  Eating low-salt.  BP today is okay. Heart rate is elevated, he admits has not been taking Inderal lately, recommend to go back on it, RF sent. NASH: Last visit with GI was 2014.  We will check LFTs, restart vitamin E 800 units.  At some point consider refer to GI for routine evaluation. Dyslipidemia: Last LDL 172.  Rechecking today.  Medication is a  consideration. Morbid density: Not making much progress.   Preventive care: Flu shot today, recommend a COVID booster RTC 4 months     This visit occurred during the SARS-CoV-2 public health emergency.  Safety protocols were in place, including screening questions prior to the visit, additional usage of staff PPE, and extensive cleaning of exam room while observing appropriate contact time as indicated for disinfecting solutions.

## 2020-10-22 NOTE — Patient Instructions (Addendum)
Recommend to proceed with the following vaccine at your pharmacy:  Covid #3  Take Inderal regularly along with your other medications  Check the  blood pressure every week BP GOAL is between 110/65 and  135/85. If it is consistently higher or lower, let me know     GO TO THE LAB : Get the blood work     GO TO THE FRONT DESK, PLEASE SCHEDULE YOUR APPOINTMENTS Come back for a checkup in 4 months

## 2020-10-22 NOTE — Assessment & Plan Note (Addendum)
HTN: Since the last visit, he checked his BPs frequently, they were getting better, has not checked lately.  Eating low-salt.  BP today is okay. Heart rate is elevated, he admits has not been taking Inderal lately, recommend to go back on it, RF sent. NASH: Last visit with GI was 2014.  We will check LFTs, restart vitamin E 800 units.  At some point consider refer to GI for routine evaluation. Dyslipidemia: Last LDL 172.  Rechecking today.  Medication is a consideration. Morbid density: Not making much progress.   Preventive care: Flu shot today, recommend a COVID booster RTC 4 months

## 2020-10-28 MED ORDER — ATORVASTATIN CALCIUM 40 MG PO TABS: 40.0000 mg | ORAL_TABLET | Freq: Every day | ORAL | 1 refills | Status: DC

## 2020-10-28 NOTE — Addendum Note (Signed)
Addended byConrad St. John D on: 10/28/2020 09:48 AM   Modules accepted: Orders

## 2020-11-05 ENCOUNTER — Other Ambulatory Visit: Payer: Self-pay | Admitting: Physician Assistant

## 2020-11-13 ENCOUNTER — Encounter: Payer: Self-pay | Admitting: Physician Assistant

## 2020-11-13 ENCOUNTER — Other Ambulatory Visit: Payer: Self-pay

## 2020-11-13 ENCOUNTER — Ambulatory Visit (INDEPENDENT_AMBULATORY_CARE_PROVIDER_SITE_OTHER): Payer: 59 | Admitting: Physician Assistant

## 2020-11-13 DIAGNOSIS — F411 Generalized anxiety disorder: Secondary | ICD-10-CM

## 2020-11-13 DIAGNOSIS — G4700 Insomnia, unspecified: Secondary | ICD-10-CM

## 2020-11-13 DIAGNOSIS — F3341 Major depressive disorder, recurrent, in partial remission: Secondary | ICD-10-CM

## 2020-11-13 DIAGNOSIS — F900 Attention-deficit hyperactivity disorder, predominantly inattentive type: Secondary | ICD-10-CM | POA: Diagnosis not present

## 2020-11-13 MED ORDER — ATOMOXETINE HCL 80 MG PO CAPS: 80.0000 mg | ORAL_CAPSULE | Freq: Every day | ORAL | 1 refills | Status: DC

## 2020-11-13 NOTE — Progress Notes (Signed)
[facility]  Patient ID: [name],  MRN: [id number]  PCP: Wanda Plump, MD  Date of Evaluation: 11/13/2020 Time spent:20 minutes  Chief Complaint:  Chief Complaint   ADD; Anxiety; Depression; Insomnia; Follow-up      HISTORY/CURRENT STATUS: HPI For routine med check.  Sleeping much better. Thea Silversmith that we added at LOV helps him go to sleep and stay asleep.  He is only needing 100 mg.  He is not having any grogginess the next day when he gets up.  Still not doing well as far as focus and concentration go.  He has a hard time staying on task and getting things done without being so distracted.  He is still not working but has put in some applications as of last week to [employer] and [employer] that are close to his home.  He is hoping to get something part-time to give him something to do as well as have some money coming in.  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  Patient denies loss of interest in usual activities and is able to enjoy things.  Denies decreased energy or motivation.  Appetite has not changed.  No extreme sadness, tearfulness, or feelings of hopelessness.  Anxiety is much better controlled now.  His PCP has told him to stay on the propranolol twice a day, not use it as needed.  Denies suicidal or homicidal thoughts.  Patient denies increased energy with decreased need for sleep, no increased talkativeness, no racing thoughts, no impulsivity or risky behaviors, no increased spending, no increased libido, no grandiosity, no increased irritability or anger, and no hallucinations.  Denies dizziness, syncope, seizures, numbness, tingling, tremor, tics, unsteady gait, slurred speech, confusion. Denies muscle or joint pain, stiffness, or dystonia.   Individual Medical History/ Review of Systems: Changes? :No    Past medications for mental health diagnoses include: Lithium, BuSpar, Xanax, Seroquel, Risperdal, Depakote, Zoloft, Clonidine  caused fatigue and increased depression.   Allergies: Patient has no known allergies.  Current Medications:  Current Outpatient Medications:    atomoxetine (STRATTERA) 80 MG capsule, Take 1 capsule (80 mg total) by mouth daily., Disp: 30 capsule, Rfl: 1   atorvastatin (LIPITOR) 40 MG tablet, Take 1 tablet (40 mg total) by mouth at bedtime., Disp: 30 tablet, Rfl: 1   gabapentin (NEURONTIN) 100 MG capsule, Take 2 capsules (200 mg total) by mouth 2 (two) times daily., Disp: 120 capsule, Rfl: 2   hydrOXYzine (ATARAX/VISTARIL) 10 MG tablet, Take 1-3 tablets (10-30 mg total) by mouth 3 (three) times daily as needed., Disp: 60 tablet, Rfl: 1   propranolol (INDERAL) 20 MG tablet, Take 1 tablet (20 mg total) by mouth 3 (three) times daily as needed., Disp: 60 tablet, Rfl: 12   sertraline (ZOLOFT) 100 MG tablet, TAKE 2 TABLETS(200 MG) BY MOUTH DAILY, Disp: 60 tablet, Rfl: 0   traZODone (DESYREL) 100 MG tablet, Take 1-2 tablets (100-200 mg total) by mouth at bedtime as needed for sleep., Disp: 60 tablet, Rfl: 1   vitamin E 180 MG (400 UNITS) capsule, Take 2 capsules (800 Units total) by mouth daily., Disp: , Rfl:  Medication Side Effects: none  Family Medical/ Social History: Changes? No  MENTAL HEALTH EXAM:  There were no vitals taken for this visit.There is no height or weight on file to calculate BMI.    General Appearance: Casual, Well Groomed and Obese  Eye Contact:  Fair  Speech:  Clear and Coherent and Normal Rate  Volume:  Normal  Mood:  Euthymic  Affect:  Congruent  Thought Process:  logical   Orientation:  Full (Time, Place, and Person)  Thought Content: Logical   Suicidal Thoughts:  No  Homicidal Thoughts:  No  Memory:  WNL  Judgement:  Good  Insight:  Good  Psychomotor Activity:  Normal  Concentration:  Concentration: Fair and Attention Span: Fair  Recall:  Good  Fund of Knowledge: Good  Language: Good  Assets:  Desire for Improvement  ADL's:  Intact  Cognition: WNL   Prognosis:  Good    DIAGNOSES:    ICD-10-CM   1. Attention deficit hyperactivity disorder (ADHD), predominantly inattentive type  F90.0     2. Insomnia, unspecified type  G47.00     3. Generalized anxiety disorder  F41.1     4. Recurrent major depressive disorder, in partial remission (HCC)  F33.41        Receiving Psychotherapy: No Had been seeing Elio Forget, [facility].  Has not seen him in a while.   RECOMMENDATIONS:  PDMP was reviewed.  Last Xanax filled 11/17/2019.  No other controlled substances. I provided 20 minutes of face to face time during this encounter, including time spent before and after the visit in records review, medical decision making, and charting.  I am glad he is doing better with sleep.  No changes will be made in the trazodone. We discussed increasing the Strattera.  If he is not seeing improvement with that increase, we may need to either resort to [place], guanfacine or even going to a stimulant.  He wants to use stimulant as a last resort, and so do. Continue trazodone 100 mg, 1-2 nightly as needed sleep.   Increase Strattera to 80 mg, 1 p.o. daily.   Continue gabapentin 200 mg p.o. twice daily. Continue hydroxyzine 10 mg, 1-3 p.o. 3 times daily as needed. Continue propranolol 20 mg, 1 p.o. twice daily per PCP.   Continue Zoloft 100 mg, 2 p.o. nightly. Return in 4-6 weeks.  [name], PA-C

## 2020-12-07 ENCOUNTER — Other Ambulatory Visit: Payer: Self-pay | Admitting: Physician Assistant

## 2020-12-26 ENCOUNTER — Ambulatory Visit (INDEPENDENT_AMBULATORY_CARE_PROVIDER_SITE_OTHER): Payer: 59 | Admitting: Physician Assistant

## 2020-12-26 ENCOUNTER — Other Ambulatory Visit: Payer: Self-pay

## 2020-12-26 ENCOUNTER — Encounter: Payer: Self-pay | Admitting: Physician Assistant

## 2020-12-26 VITALS — BP 146/87 | HR 110

## 2020-12-26 DIAGNOSIS — F411 Generalized anxiety disorder: Secondary | ICD-10-CM | POA: Diagnosis not present

## 2020-12-26 DIAGNOSIS — G4700 Insomnia, unspecified: Secondary | ICD-10-CM

## 2020-12-26 DIAGNOSIS — F3341 Major depressive disorder, recurrent, in partial remission: Secondary | ICD-10-CM

## 2020-12-26 DIAGNOSIS — E6609 Other obesity due to excess calories: Secondary | ICD-10-CM

## 2020-12-26 DIAGNOSIS — R030 Elevated blood-pressure reading, without diagnosis of hypertension: Secondary | ICD-10-CM

## 2020-12-26 DIAGNOSIS — F900 Attention-deficit hyperactivity disorder, predominantly inattentive type: Secondary | ICD-10-CM | POA: Diagnosis not present

## 2020-12-26 MED ORDER — TRAZODONE HCL 100 MG PO TABS: ORAL_TABLET | ORAL | 2 refills | Status: DC

## 2020-12-26 MED ORDER — GABAPENTIN 100 MG PO CAPS: 200.0000 mg | ORAL_CAPSULE | Freq: Two times a day (BID) | ORAL | 2 refills | Status: DC

## 2020-12-26 MED ORDER — ATOMOXETINE HCL 80 MG PO CAPS: 80.0000 mg | ORAL_CAPSULE | Freq: Every day | ORAL | 2 refills | Status: DC

## 2020-12-26 MED ORDER — SERTRALINE HCL 100 MG PO TABS: ORAL_TABLET | ORAL | 2 refills | Status: DC

## 2020-12-26 NOTE — Progress Notes (Signed)
[facility]  Patient ID: [name],  MRN: [id number]  PCP: Wanda Plump, MD  Date of Evaluation: 12/26/2020 Time spent:30 minutes  Chief Complaint:  Chief Complaint   Anxiety; Depression; Insomnia; Follow-up     HISTORY/CURRENT STATUS: HPI For routine med check.  Has had a few interviews recently, did poorly so didn't get a job. States he'll try again later.  Also still wants to take some classes or certification courses so he can work in IT someday.  At the last visit we increased the Strattera.  States it is working very well.  He is more productive and does not get distracted as easily.  States he is not nearly as anxious as he used to be in the past and not having the social anxiety like he did.  It does still make him nervous to be around a lot of people but he does not mind it as much.  He thinks working or going back to school will help him not be so nervous around people.  Has not taken the hydroxyzine in a long time.  Has not needed it.  Taking the propranolol seems to help with the anxiety 2.  Patient denies loss of interest in usual activities and is able to enjoy things.  Denies decreased energy or motivation.  Appetite has not changed.  No extreme sadness, tearfulness, or feelings of hopelessness.  He is sleeping better, using the trazodone.  Denies suicidal or homicidal thoughts.  Patient denies increased energy with decreased need for sleep, no increased talkativeness, no racing thoughts, no impulsivity or risky behaviors, no increased spending, no increased libido, no grandiosity, no increased irritability or anger, and no hallucinations.  Denies dizziness, syncope, seizures, numbness, tingling, tremor, tics, unsteady gait, slurred speech, confusion. Denies muscle or joint pain, stiffness, or dystonia.   Individual Medical History/ Review of Systems: Changes? :No    Past medications for mental health diagnoses include: Lithium, BuSpar, Xanax,  Seroquel, Risperdal, Depakote, Zoloft, Clonidine caused fatigue and increased depression.   Allergies: Patient has no known allergies.  Current Medications:  Current Outpatient Medications:    atorvastatin (LIPITOR) 40 MG tablet, Take 1 tablet (40 mg total) by mouth at bedtime., Disp: 30 tablet, Rfl: 1   propranolol (INDERAL) 20 MG tablet, Take 1 tablet (20 mg total) by mouth 3 (three) times daily as needed. (Patient taking differently: Take 20 mg by mouth 2 (two) times daily.), Disp: 60 tablet, Rfl: 12   vitamin E 180 MG (400 UNITS) capsule, Take 2 capsules (800 Units total) by mouth daily., Disp: , Rfl:    atomoxetine (STRATTERA) 80 MG capsule, Take 1 capsule (80 mg total) by mouth daily., Disp: 30 capsule, Rfl: 2   gabapentin (NEURONTIN) 100 MG capsule, Take 2 capsules (200 mg total) by mouth 2 (two) times daily., Disp: 120 capsule, Rfl: 2   hydrOXYzine (ATARAX/VISTARIL) 10 MG tablet, Take 1-3 tablets (10-30 mg total) by mouth 3 (three) times daily as needed. (Patient not taking: Reported on 12/26/2020), Disp: 60 tablet, Rfl: 1   sertraline (ZOLOFT) 100 MG tablet, TAKE 2 TABLETS(200 MG) BY MOUTH DAILY, Disp: 60 tablet, Rfl: 2   traZODone (DESYREL) 100 MG tablet, TAKE 1 TO 2 TABLETS(100 TO 200 MG) BY MOUTH AT BEDTIME AS NEEDED FOR SLEEP, Disp: 60 tablet, Rfl: 2 Medication Side Effects: none  Family Medical/ Social History: Changes? No  MENTAL HEALTH EXAM:  Blood pressure (!) 146/87, pulse (!) 110.There is no height or weight on file to  calculate BMI.  States he had coffee right before this office visit.  Also has not taken the propranolol yet today.  General Appearance: Casual, Well Groomed and Obese  Eye Contact:  Fair  Speech:  Clear and Coherent and Normal Rate  Volume:  Normal  Mood:  Euthymic  Affect:  Congruent  Thought Process:  logical   Orientation:  Full (Time, Place, and Person)  Thought Content: Logical   Suicidal Thoughts:  No  Homicidal Thoughts:  No  Memory:  WNL   Judgement:  Good  Insight:  Good  Psychomotor Activity:  Normal  Concentration:  Concentration: Good and Attention Span: Good  Recall:  Good  Fund of Knowledge: Good  Language: Good  Assets:  Desire for Improvement  ADL's:  Intact  Cognition: WNL  Prognosis:  Good    DIAGNOSES:    ICD-10-CM   1. Recurrent major depressive disorder, in partial remission (HCC)  F33.41     2. Generalized anxiety disorder  F41.1     3. Insomnia, unspecified type  G47.00     4. Attention deficit hyperactivity disorder (ADHD), predominantly inattentive type  F90.0     5. Obesity due to excess calories without serious comorbidity, unspecified classification  E66.09     6. Elevated blood pressure reading  R03.0         Receiving Psychotherapy: No Had been seeing Elio Forget, [facility].  Has not seen him in a while.   RECOMMENDATIONS:  PDMP was reviewed.  Last Xanax filled 11/17/2019.  No more recent controlled substances. I provided 30 minutes of face to face time during this encounter, including time spent before and after the visit in records review, medical decision making, counseling pertinent to today's visit, and charting.  He's doing well with all psych meds so no changes will be made.  Continue Trazodone 100 mg, 1-2 nightly as needed sleep.   Continue Strattera 80 mg, 1 p.o. daily.   Continue gabapentin 200 mg p.o. twice daily. Continue hydroxyzine 10 mg, 1-3 p.o. 3 times daily as needed. Continue propranolol 20 mg, 1 p.o. twice daily per PCP.   Continue Zoloft 100 mg, 2 p.o. nightly. Return in 3 months.  [name], PA-C

## 2021-01-05 ENCOUNTER — Other Ambulatory Visit: Payer: Self-pay | Admitting: Internal Medicine

## 2021-01-13 ENCOUNTER — Encounter: Payer: Self-pay | Admitting: Internal Medicine

## 2021-02-18 ENCOUNTER — Ambulatory Visit (INDEPENDENT_AMBULATORY_CARE_PROVIDER_SITE_OTHER): Payer: 59 | Admitting: Internal Medicine

## 2021-02-18 ENCOUNTER — Encounter: Payer: Self-pay | Admitting: Internal Medicine

## 2021-02-18 VITALS — BP 136/88 | HR 87 | Temp 98.3°F | Resp 16 | Ht 72.0 in | Wt 314.5 lb

## 2021-02-18 DIAGNOSIS — K7581 Nonalcoholic steatohepatitis (NASH): Secondary | ICD-10-CM

## 2021-02-18 DIAGNOSIS — E785 Hyperlipidemia, unspecified: Secondary | ICD-10-CM | POA: Diagnosis not present

## 2021-02-18 DIAGNOSIS — I10 Essential (primary) hypertension: Secondary | ICD-10-CM | POA: Diagnosis not present

## 2021-02-18 LAB — COMPREHENSIVE METABOLIC PANEL
ALT: 36 U/L (ref 0–53)
AST: 26 U/L (ref 0–37)
Albumin: 4.8 g/dL (ref 3.5–5.2)
Alkaline Phosphatase: 59 U/L (ref 39–117)
BUN: 13 mg/dL (ref 6–23)
CO2: 31 mEq/L (ref 19–32)
Calcium: 9.9 mg/dL (ref 8.4–10.5)
Chloride: 97 mEq/L (ref 96–112)
Creatinine, Ser: 0.95 mg/dL (ref 0.40–1.50)
GFR: 110.13 mL/min (ref >60.00)
Glucose, Bld: 84 mg/dL (ref 70–99)
Potassium: 4.5 mEq/L (ref 3.5–5.1)
Sodium: 134 mEq/L — ABNORMAL LOW (ref 135–145)
Total Bilirubin: 0.8 mg/dL (ref 0.2–1.2)
Total Protein: 7.5 g/dL (ref 6.0–8.3)

## 2021-02-18 LAB — CBC WITH DIFFERENTIAL/PLATELET
Basophils Absolute: 0.1 10*3/uL (ref 0.0–0.1)
Basophils Relative: 0.7 % (ref 0.0–3.0)
Eosinophils Absolute: 0.1 10*3/uL (ref 0.0–0.7)
Eosinophils Relative: 1.4 % (ref 0.0–5.0)
HCT: 45 % (ref 39.0–52.0)
Hemoglobin: 15.1 g/dL (ref 13.0–17.0)
Lymphocytes Relative: 35.9 % (ref 12.0–46.0)
Lymphs Abs: 3.1 10*3/uL (ref 0.7–4.0)
MCHC: 33.6 g/dL (ref 30.0–36.0)
MCV: 88.1 fl (ref 78.0–100.0)
Monocytes Absolute: 0.9 10*3/uL (ref 0.1–1.0)
Monocytes Relative: 9.9 % (ref 3.0–12.0)
Neutro Abs: 4.5 10*3/uL (ref 1.4–7.7)
Neutrophils Relative %: 52.1 % (ref 43.0–77.0)
Platelets: 243 10*3/uL (ref 150.0–400.0)
RBC: 5.11 Mil/uL (ref 4.22–5.81)
RDW: 13.7 % (ref 11.5–15.5)
WBC: 8.6 10*3/uL (ref 4.0–10.5)

## 2021-02-18 LAB — LIPID PANEL
Cholesterol: 161 mg/dL (ref 0–200)
HDL: 51 mg/dL (ref >39.00)
LDL Cholesterol: 99 mg/dL (ref 0–99)
NonHDL: 110.48
Total CHOL/HDL Ratio: 3
Triglycerides: 57 mg/dL (ref 0.0–149.0)
VLDL: 11.4 mg/dL (ref 0.0–40.0)

## 2021-02-18 NOTE — Assessment & Plan Note (Signed)
HTN: BP today is ok Morbid obesity: Making progress, has cut processed foods and fats. Praised! High cholesterol: Based on last FLP started Lipitor 10-2020.  Rechecking FLP. NASH: On vitamin E, checking a CMP, CBC. Major depression, last visit with behavioral 12/26/2020.  On multiple meds, he seems to be doing very, more talkative and happy than in previous visit.   Preventive care:covid booster rec  RTC 4 months CPX

## 2021-02-18 NOTE — Patient Instructions (Signed)
You are doing great with your change in diet.   GO TO THE LAB : Get the blood work     GO TO THE FRONT DESK, PLEASE SCHEDULE YOUR APPOINTMENTS Come back for a physical exam in 4 months

## 2021-02-18 NOTE — Progress Notes (Signed)
Subjective:    Patient ID: [name], male    DOB: 1995-08-14, 26 y.o.   MRN: [id number]  DOS:  02/18/2021 Type of visit - description: f/u  Routine follow-up  High cholesterol: Started Lipitor based on the last FLP.  Good compliance and tolerance. Morbid obesity: Doing better.  See weights.  Has no major concerns.  Wt Readings from Last 3 Encounters:  02/18/21 (!) 314 lb 8 oz (142.7 kg)  10/22/20 (!) 328 lb 2 oz (148.8 kg)  04/22/20 (!) 327 lb 8 oz (148.6 kg)     Review of Systems See above   Past Medical History:  Diagnosis Date   Hypospadias    Kidney stone    x4; 2 at age 58, lithotrypsy 01-2013   Liver function test abnormality    h/o   Major depression     was admited to [facility] x 1 week, [name]  is his psychiatrist-- Dx made 01-2007, started on meds then   Obesity    Suicidal ideations 2017   cutting behavior    Past Surgical History:  Procedure Laterality Date   LITHOTRIPSY     LIVER BIOPSY     WISDOM TOOTH EXTRACTION      Current Outpatient Medications  Medication Instructions   atomoxetine (STRATTERA) 80 mg, Oral, Daily   atorvastatin (LIPITOR) 40 MG tablet TAKE 1 TABLET(40 MG) BY MOUTH AT BEDTIME   gabapentin (NEURONTIN) 200 mg, Oral, 2 times daily   hydrOXYzine (ATARAX) 10-30 mg, Oral, 3 times daily PRN   propranolol (INDERAL) 20 mg, Oral, 3 times daily PRN   sertraline (ZOLOFT) 100 MG tablet TAKE 2 TABLETS(200 MG) BY MOUTH DAILY   traZODone (DESYREL) 100 MG tablet TAKE 1 TO 2 TABLETS(100 TO 200 MG) BY MOUTH AT BEDTIME AS NEEDED FOR SLEEP   vitamin E (VITAMIN E) 800 Units, Oral, Daily       Objective:   Physical Exam BP 136/88 (BP Location: Right Arm, Patient Position: Sitting, Cuff Size: Normal)    Pulse 87    Temp 98.3 F (36.8 C) (Oral)    Resp 16    Ht 6' (1.829 m)    Wt (!) 314 lb 8 oz (142.7 kg)    SpO2 97%    BMI 42.65 kg/m  General:   Well developed, NAD, BMI noted. HEENT:  Normocephalic . Face symmetric, atraumatic Lungs:   CTA B Normal respiratory effort, no intercostal retractions, no accessory muscle use. Heart: RRR,  no murmur.  Lower extremities: no pretibial edema bilaterally  Skin: Not pale. Not jaundice Neurologic:  alert & oriented X3.  Speech normal, gait appropriate for age and unassisted Psych--  Cognition and judgment appear intact.  Cooperative with normal attention span and concentration.  Behavior appropriate. No anxious or depressed appearing.      Assessment    Assessment: Major depression  -- was admited to [facility] x 1 week,  sees psychiatriy-- Dx made 01-2007, was rx meds then Morbid obesity H/o Hypospadias H/o hypogonadism, pituitary insufficiency, used to see Dr. Fransico Michael or Dr Everardo All; Testost wnl 2016 NASH: H/o increase LFTs, saw GI 2014 >> Bx + for NASH, rx wt loss , vit e 800 u Urolithiasis --x4; 2 at age 26, lithotrypsy 37-2015  PLAN: HTN: BP today is ok Morbid obesity: Making progress, has cut processed foods and fats. Praised! High cholesterol: Based on last FLP started Lipitor 10-2020.  Rechecking FLP. NASH: On vitamin E, checking a CMP, CBC. Major depression, last visit with  behavioral 12/26/2020.  On multiple meds, he seems to be doing very, more talkative and happy than in previous visit.   Preventive care:covid booster rec  RTC 4 months CPX    This visit occurred during the SARS-CoV-2 public health emergency.  Safety protocols were in place, including screening questions prior to the visit, additional usage of staff PPE, and extensive cleaning of exam room while observing appropriate contact time as indicated for disinfecting solutions.

## 2021-02-20 MED ORDER — ATORVASTATIN CALCIUM 40 MG PO TABS: ORAL_TABLET | ORAL | 3 refills | Status: DC

## 2021-02-20 NOTE — Addendum Note (Signed)
Addended byConrad Tynan D on: 02/20/2021 02:21 PM   Modules accepted: Orders

## 2021-03-26 ENCOUNTER — Other Ambulatory Visit: Payer: Self-pay

## 2021-03-26 ENCOUNTER — Encounter: Payer: Self-pay | Admitting: Physician Assistant

## 2021-03-26 ENCOUNTER — Ambulatory Visit (INDEPENDENT_AMBULATORY_CARE_PROVIDER_SITE_OTHER): Payer: 59 | Admitting: Physician Assistant

## 2021-03-26 DIAGNOSIS — F3341 Major depressive disorder, recurrent, in partial remission: Secondary | ICD-10-CM | POA: Diagnosis not present

## 2021-03-26 DIAGNOSIS — F900 Attention-deficit hyperactivity disorder, predominantly inattentive type: Secondary | ICD-10-CM

## 2021-03-26 DIAGNOSIS — G4700 Insomnia, unspecified: Secondary | ICD-10-CM | POA: Diagnosis not present

## 2021-03-26 DIAGNOSIS — F411 Generalized anxiety disorder: Secondary | ICD-10-CM

## 2021-03-26 MED ORDER — ATOMOXETINE HCL 80 MG PO CAPS: 80.0000 mg | ORAL_CAPSULE | Freq: Every day | ORAL | 5 refills | Status: DC

## 2021-03-26 MED ORDER — SERTRALINE HCL 100 MG PO TABS: ORAL_TABLET | ORAL | 5 refills | Status: DC

## 2021-03-26 MED ORDER — TRAZODONE HCL 100 MG PO TABS: ORAL_TABLET | ORAL | 5 refills | Status: DC

## 2021-03-26 MED ORDER — GABAPENTIN 100 MG PO CAPS: 200.0000 mg | ORAL_CAPSULE | Freq: Two times a day (BID) | ORAL | 5 refills | Status: DC

## 2021-03-26 NOTE — Progress Notes (Signed)
[facility] ? ?Patient ID: [name],  ?MRN: [id number] ? ?PCP: Wanda Plump, MD ? ?Date of Evaluation: 03/26/2021 ?Time spent:20 minutes ? ?Chief Complaint:  ?Chief Complaint   ?Anxiety; ADD; Depression; Follow-up; Insomnia ?  ? ? ?HISTORY/CURRENT STATUS: ?HPI For routine med check. ? ?Doing about the same, not depressed. Patient denies loss of interest in usual activities and is able to enjoy things.  Denies decreased energy or motivation.  Appetite has not changed.  No extreme sadness, tearfulness, or feelings of hopelessness.  Denies any changes in concentration, making decisions or remembering things. Wilhemena Durie is working well. Sleeps well. Trazodone helps.  Still has anxiety but it is not much to complain about he states.  No suicidal or homicidal thoughts. ? ?Still not working.  States he spends his days doing his computer class, taking a drive in the country.  He gave up looking for a job.  Says he goes back and forth from being motivated looking to not motivated at all.  He lives with his dad who does encourage him to get a job his dad is a Chartered certified accountant. ? ?Patient denies increased energy with decreased need for sleep, no increased talkativeness, no racing thoughts, no impulsivity or risky behaviors, no increased spending, no increased libido, no grandiosity, no increased irritability or anger, and no hallucinations. ? ?Denies dizziness, syncope, seizures, numbness, tingling, tremor, tics, unsteady gait, slurred speech, confusion. Denies muscle or joint pain, stiffness, or dystonia.  ? ?Individual Medical History/ Review of Systems: Changes? :No   ? ?Past medications for mental health diagnoses include: ?Lithium, BuSpar, Xanax, Seroquel, Risperdal, Depakote, Zoloft, Clonidine caused fatigue and increased depression.  ? ?Allergies: Patient has no known allergies. ? ?Current Medications:  ?Current Outpatient Medications:  ?  atorvastatin (LIPITOR) 40 MG tablet, TAKE 1 TABLET(40 MG) BY MOUTH  AT BEDTIME, Disp: 90 tablet, Rfl: 3 ?  propranolol (INDERAL) 20 MG tablet, Take 1 tablet (20 mg total) by mouth 3 (three) times daily as needed. (Patient taking differently: Take 20 mg by mouth 2 (two) times daily.), Disp: 60 tablet, Rfl: 12 ?  vitamin E 180 MG (400 UNITS) capsule, Take 2 capsules (800 Units total) by mouth daily., Disp: , Rfl:  ?  atomoxetine (STRATTERA) 80 MG capsule, Take 1 capsule (80 mg total) by mouth daily., Disp: 30 capsule, Rfl: 5 ?  gabapentin (NEURONTIN) 100 MG capsule, Take 2 capsules (200 mg total) by mouth 2 (two) times daily., Disp: 120 capsule, Rfl: 5 ?  sertraline (ZOLOFT) 100 MG tablet, TAKE 2 TABLETS(200 MG) BY MOUTH DAILY, Disp: 60 tablet, Rfl: 5 ?  traZODone (DESYREL) 100 MG tablet, TAKE 1 TO 2 TABLETS(100 TO 200 MG) BY MOUTH AT BEDTIME AS NEEDED FOR SLEEP, Disp: 60 tablet, Rfl: 5 ?Medication Side Effects: none ? ?Family Medical/ Social History: Changes? No ? ?MENTAL HEALTH EXAM: ? ?There were no vitals taken for this visit.There is no height or weight on file to calculate BMI.    ?General Appearance: Casual, Well Groomed and Obese  ?Eye Contact:  Fair  ?Speech:  Clear and Coherent and Normal Rate  ?Volume:  Normal  ?Mood:  Euthymic  ?Affect:  Congruent  ?Thought Process:  logical   ?Orientation:  Full (Time, Place, and Person)  ?Thought Content: Logical   ?Suicidal Thoughts:  No  ?Homicidal Thoughts:  No  ?Memory:  WNL  ?Judgement:  Good  ?Insight:  Good  ?Psychomotor Activity:  Normal  ?Concentration:  Concentration: Good and Attention Span: Good  ?  Recall:  Good  ?Fund of Knowledge: Good  ?Language: Good  ?Assets:  Desire for Improvement ?Financial Resources/Insurance ?Housing ?Transportation  ?ADL's:  Intact  ?Cognition: WNL  ?Prognosis:  Good  ? ? ?DIAGNOSES:  ?  ICD-10-CM   ?1. Recurrent major depressive disorder, in partial remission (HCC)  F33.41   ?  ?2. Generalized anxiety disorder  F41.1   ?  ?3. Insomnia, unspecified type  G47.00   ?  ?4. Attention deficit  hyperactivity disorder (ADHD), predominantly inattentive type  F90.0   ?  ? ? ?Receiving Psychotherapy: No   ? ?RECOMMENDATIONS:  ?PDMP was reviewed.  Last Xanax filled 11/17/2019.  No more recent controlled substances. ?I provided 20 minutes of face to face time during this encounter, including time spent before and after the visit in records review, medical decision making, counseling pertinent to today's visit, and charting.  ?He is doing well as far as his medications go.  I again encouraged him to finish school and find employment.  That will help his mental health. ?No changes in meds are necessary. ? ?Continue Trazodone 100 mg, 1-2 nightly as needed sleep.   ?Continue Strattera 80 mg, 1 p.o. daily.   ?Continue gabapentin 200 mg p.o. twice daily. ?Continue hydroxyzine 10 mg, 1-3 p.o. 3 times daily as needed. ?Continue propranolol 20 mg, 1 p.o. twice daily per PCP.   ?Continue Zoloft 100 mg, 2 p.o. nightly. ?Return in 6 months. ? ?[name], PA-C  ?

## 2021-06-26 ENCOUNTER — Ambulatory Visit: Payer: 59 | Admitting: Internal Medicine

## 2021-09-29 ENCOUNTER — Encounter: Payer: Self-pay | Admitting: Physician Assistant

## 2021-09-29 ENCOUNTER — Ambulatory Visit (INDEPENDENT_AMBULATORY_CARE_PROVIDER_SITE_OTHER): Payer: 59 | Admitting: Physician Assistant

## 2021-09-29 VITALS — BP 133/101 | HR 88

## 2021-09-29 DIAGNOSIS — F411 Generalized anxiety disorder: Secondary | ICD-10-CM

## 2021-09-29 DIAGNOSIS — G4700 Insomnia, unspecified: Secondary | ICD-10-CM

## 2021-09-29 DIAGNOSIS — F3341 Major depressive disorder, recurrent, in partial remission: Secondary | ICD-10-CM

## 2021-09-29 DIAGNOSIS — F900 Attention-deficit hyperactivity disorder, predominantly inattentive type: Secondary | ICD-10-CM | POA: Diagnosis not present

## 2021-09-29 DIAGNOSIS — E6609 Other obesity due to excess calories: Secondary | ICD-10-CM

## 2021-09-29 MED ORDER — ATOMOXETINE HCL 100 MG PO CAPS: 100.0000 mg | ORAL_CAPSULE | Freq: Every day | ORAL | 1 refills | Status: DC

## 2021-09-29 MED ORDER — GABAPENTIN 100 MG PO CAPS: 200.0000 mg | ORAL_CAPSULE | Freq: Two times a day (BID) | ORAL | 5 refills | Status: DC

## 2021-09-29 MED ORDER — SERTRALINE HCL 100 MG PO TABS: ORAL_TABLET | ORAL | 5 refills | Status: DC

## 2021-09-29 MED ORDER — TRAZODONE HCL 100 MG PO TABS: ORAL_TABLET | ORAL | 5 refills | Status: DC

## 2021-09-29 NOTE — Progress Notes (Signed)
[facility]  Patient ID: [name],  MRN: [id number]  PCP: Wanda Plump, MD  Date of Evaluation: 09/29/2021 Time spent:20 minutes  Chief Complaint:  Chief Complaint   Depression; Insomnia; Follow-up; ADD    HISTORY/CURRENT STATUS: HPI For routine 4-month med check.  His main problem is not being able to focus well.  It is worse when driving.  He almost pulled out in front of somebody yesterday because he could not stay focused.  He is not working but does things around the house and it is more difficult to stay on task and get things done in a timely manner.  Patient is able to enjoy things.  Energy and motivation are good. No extreme sadness, tearfulness, or feelings of hopelessness.  Sleeps well most of the time. ADLs and personal hygiene are normal.  Appetite has not changed.  Weight is stable.  Denies suicidal or homicidal thoughts.  States anxiety is well controlled.  He takes propranolol for his blood pressure and occasionally will take an extra 1 if he is feeling anxious.  It is effective.  States he has not taken his meds this morning, his blood pressure is high this morning.  He checks his blood pressure at home several times a week and has always within normal range, 120/70s or thereabouts.  Patient denies increased energy with decreased need for sleep, increased talkativeness, racing thoughts, impulsivity or risky behaviors, increased spending, increased libido, grandiosity, increased irritability or anger, paranoia, or hallucinations.  Denies dizziness, syncope, seizures, numbness, tingling, tremor, tics, unsteady gait, slurred speech, confusion. Denies muscle or joint pain, stiffness, or dystonia.   Individual Medical History/ Review of Systems: Changes? :No    Past medications for mental health diagnoses include: Lithium, BuSpar, Xanax, Seroquel, Risperdal, Depakote, Zoloft, Clonidine caused fatigue and increased depression.   Allergies: Patient has  no known allergies.  Current Medications:  Current Outpatient Medications:    atomoxetine (STRATTERA) 100 MG capsule, Take 1 capsule (100 mg total) by mouth daily., Disp: 30 capsule, Rfl: 1   atorvastatin (LIPITOR) 40 MG tablet, TAKE 1 TABLET(40 MG) BY MOUTH AT BEDTIME, Disp: 90 tablet, Rfl: 3   propranolol (INDERAL) 20 MG tablet, Take 1 tablet (20 mg total) by mouth 3 (three) times daily as needed. (Patient taking differently: Take 20 mg by mouth 2 (two) times daily.), Disp: 60 tablet, Rfl: 12   gabapentin (NEURONTIN) 100 MG capsule, Take 2 capsules (200 mg total) by mouth 2 (two) times daily., Disp: 120 capsule, Rfl: 5   sertraline (ZOLOFT) 100 MG tablet, TAKE 2 TABLETS(200 MG) BY MOUTH DAILY, Disp: 60 tablet, Rfl: 5   traZODone (DESYREL) 100 MG tablet, TAKE 1 TO 2 TABLETS(100 TO 200 MG) BY MOUTH AT BEDTIME AS NEEDED FOR SLEEP, Disp: 60 tablet, Rfl: 5   vitamin E 180 MG (400 UNITS) capsule, Take 2 capsules (800 Units total) by mouth daily. (Patient not taking: Reported on 09/29/2021), Disp: , Rfl:  Medication Side Effects: none  Family Medical/ Social History: Changes? No  MENTAL HEALTH EXAM:  Blood pressure (!) 133/101, pulse 88.There is no height or weight on file to calculate BMI.    General Appearance: Casual, Well Groomed and Obese  Eye Contact:  Fair  Speech:  Clear and Coherent and Normal Rate  Volume:  Normal  Mood:  Euthymic  Affect:  Congruent  Thought Process:  logical   Orientation:  Full (Time, Place, and Person)  Thought Content: Logical   Suicidal Thoughts:  No  Homicidal Thoughts:  No  Memory:  WNL  Judgement:  Good  Insight:  Good  Psychomotor Activity:  Normal  Concentration:  Concentration: Good and Attention Span: Good  Recall:  Good  Fund of Knowledge: Good  Language: Good  Assets:  Desire for Improvement  ADL's:  Intact  Cognition: WNL  Prognosis:  Good   DIAGNOSES:    ICD-10-CM   1. Recurrent major depressive disorder, in partial remission ([place])   F33.41     2. Generalized anxiety disorder  F41.1     3. Insomnia, unspecified type  G47.00     4. Attention deficit hyperactivity disorder (ADHD), predominantly inattentive type  F90.0     5. Obesity due to excess calories without serious comorbidity, unspecified classification  E66.09      Receiving Psychotherapy: No     RECOMMENDATIONS:  PDMP was reviewed.  Last Xanax filled 11/17/2019.  No more recent controlled substances. I provided 20 minutes of face to face time during this encounter, including time spent before and after the visit in records review, medical decision making, counseling pertinent to today's visit, and charting.   Recommend increasing the Strattera.  He has been on this dose for a long time and it sounds like it is no longer working.  He and I did not discuss this today but if he starts working and needs a stimulant, if the Strattera is not as effective as it needs to be, I may prescribe one.  Since he is not working outside the home I do not think it is necessary at this time. Self-care including healthy diet, 150 minutes of exercise per week, good sleep hygiene discussed.  Increase Strattera to 100 mg p.o. daily. Continue gabapentin 200 mg p.o. twice daily. Continue propranolol 20 mg, 1 p.o. twice daily per PCP.   Continue Zoloft 100 mg, 2 p.o. nightly. Continue trazodone 100 mg, 1-2 p.o. nightly as needed sleep. Return in 6 weeks.  Donnal Moat, PA-C

## 2021-11-08 ENCOUNTER — Other Ambulatory Visit: Payer: Self-pay | Admitting: Internal Medicine

## 2021-11-10 ENCOUNTER — Other Ambulatory Visit: Payer: Self-pay | Admitting: Internal Medicine

## 2021-11-11 ENCOUNTER — Ambulatory Visit (INDEPENDENT_AMBULATORY_CARE_PROVIDER_SITE_OTHER): Payer: 59 | Admitting: Physician Assistant

## 2021-11-11 ENCOUNTER — Encounter: Payer: Self-pay | Admitting: Physician Assistant

## 2021-11-11 VITALS — BP 150/73 | HR 100

## 2021-11-11 DIAGNOSIS — F411 Generalized anxiety disorder: Secondary | ICD-10-CM

## 2021-11-11 DIAGNOSIS — F3341 Major depressive disorder, recurrent, in partial remission: Secondary | ICD-10-CM | POA: Diagnosis not present

## 2021-11-11 DIAGNOSIS — F900 Attention-deficit hyperactivity disorder, predominantly inattentive type: Secondary | ICD-10-CM

## 2021-11-11 DIAGNOSIS — G4700 Insomnia, unspecified: Secondary | ICD-10-CM | POA: Diagnosis not present

## 2021-11-11 MED ORDER — AMPHETAMINE-DEXTROAMPHETAMINE 10 MG PO TABS: 10.0000 mg | ORAL_TABLET | Freq: Every day | ORAL | 0 refills | Status: DC

## 2021-11-11 NOTE — Progress Notes (Signed)
[facility]  Patient ID: [name],  MRN: [id number]  PCP: Colon Branch, MD  Date of Evaluation: 11/11/2021 Time spent:20 minutes  Chief Complaint:  Chief Complaint   ADD; Follow-up    HISTORY/CURRENT STATUS: HPI For routine 6 week med check.   We increased Strattera at [place].  Has made a little bit of a difference.  Still not enough though.  Starts a lot of things and then he doesn't finish them. Thoughts are scattered. Hard time staying on task.  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  Patient is able to enjoy things.  Energy and motivation are good.   No extreme sadness, tearfulness, or feelings of hopelessness.  Sleeps well most of the time. ADLs and personal hygiene are normal.   Appetite has not changed.  Weight is stable.  No complaints of anxiety.  Denies suicidal or homicidal thoughts.  Patient denies increased energy with decreased need for sleep, increased talkativeness, racing thoughts, impulsivity or risky behaviors, increased spending, increased libido, grandiosity, increased irritability or anger, paranoia, or hallucinations.  Denies dizziness, syncope, seizures, numbness, tingling, tremor, tics, unsteady gait, slurred speech, confusion. Denies muscle or joint pain, stiffness, or dystonia.   Individual Medical History/ Review of Systems: Changes? :No    Past medications for mental health diagnoses include: Lithium, BuSpar, Xanax, Seroquel, Risperdal, Depakote, Zoloft, Strattera quit working after awhile, Clonidine caused fatigue and increased depression.   Allergies: Patient has no known allergies.  Current Medications:  Current Outpatient Medications:    atomoxetine (STRATTERA) 100 MG capsule, Take 1 capsule (100 mg total) by mouth daily., Disp: 30 capsule, Rfl: 1   atorvastatin (LIPITOR) 40 MG tablet, TAKE 1 TABLET(40 MG) BY MOUTH AT BEDTIME, Disp: 90 tablet, Rfl: 3   gabapentin (NEURONTIN) 100 MG capsule, Take 2 capsules (200 mg  total) by mouth 2 (two) times daily., Disp: 120 capsule, Rfl: 5   propranolol (INDERAL) 20 MG tablet, Take 1 tablet (20 mg total) by mouth 3 (three) times daily as needed., Disp: 90 tablet, Rfl: 0   sertraline (ZOLOFT) 100 MG tablet, TAKE 2 TABLETS(200 MG) BY MOUTH DAILY, Disp: 60 tablet, Rfl: 5   traZODone (DESYREL) 100 MG tablet, TAKE 1 TO 2 TABLETS(100 TO 200 MG) BY MOUTH AT BEDTIME AS NEEDED FOR SLEEP, Disp: 60 tablet, Rfl: 5   vitamin E 180 MG (400 UNITS) capsule, Take 2 capsules (800 Units total) by mouth daily. (Patient not taking: Reported on 09/29/2021), Disp: , Rfl:  Medication Side Effects: none  Family Medical/ Social History: Changes? No   [employer]   MENTAL HEALTH EXAM:  There were no vitals taken for this visit.There is no height or weight on file to calculate BMI.    General Appearance: Casual, Well Groomed and Obese  Eye Contact:  Fair  Speech:  Clear and Coherent and Normal Rate  Volume:  Normal  Mood:  Euthymic  Affect:  Congruent  Thought Process:  logical   Orientation:  Full (Time, Place, and Person)  Thought Content: Logical   Suicidal Thoughts:  No  Homicidal Thoughts:  No  Memory:  WNL  Judgement:  Good  Insight:  Good  Psychomotor Activity:  Normal  Concentration:  Concentration: Fair and Attention Span: Good  Recall:  Good  Fund of Knowledge: Good  Language: Good  Assets:  Desire for Improvement  ADL's:  Intact  Cognition: WNL  Prognosis:  Good   DIAGNOSES:    ICD-10-CM   1. Attention  deficit hyperactivity disorder (ADHD), predominantly inattentive type  F90.0     2. Generalized anxiety disorder  F41.1     3. Recurrent major depressive disorder, in partial remission (HCC)  F33.41     4. Insomnia, unspecified type  G47.00      Receiving Psychotherapy: No     RECOMMENDATIONS:  PDMP was reviewed.  Last Xanax filled 11/17/2019.  No more recent controlled substances. I provided 20 minutes of face to face time during this encounter,  including time spent before and after the visit in records review, medical decision making, counseling pertinent to today's visit, and charting.   We discussed the different options for treatment of ADD.  He has tried clonidine which caused fatigue and worsening depression.  Strattera has been effective but just not enough.  I think at this time a stimulant is warranted.  He has to work or be in school or something productive to take this medication.  Benefits, risk and side effects were discussed.  He accepts.  He will be seeing his PCP later on today to discuss his blood pressure.  He does take propranolol per his PCP. For now we will continue Strattera as it is helping some.  Start Adderall 10 mg, one half p.o. every morning for 4 days and then increase to 1 p.o. every morning if needed. Continue Strattera 100 mg p.o. daily. Continue gabapentin 200 mg p.o. twice daily. Continue propranolol 20 mg, 1 p.o. twice daily per PCP.   Continue Zoloft 100 mg, 2 p.o. nightly. Continue trazodone 100 mg, 1-2 p.o. nightly as needed sleep. Return in 4 weeks.  [name], PA-C

## 2021-12-09 ENCOUNTER — Encounter: Payer: Self-pay | Admitting: Physician Assistant

## 2021-12-09 ENCOUNTER — Ambulatory Visit (INDEPENDENT_AMBULATORY_CARE_PROVIDER_SITE_OTHER): Payer: 59 | Admitting: Physician Assistant

## 2021-12-09 DIAGNOSIS — F900 Attention-deficit hyperactivity disorder, predominantly inattentive type: Secondary | ICD-10-CM | POA: Diagnosis not present

## 2021-12-09 DIAGNOSIS — G4700 Insomnia, unspecified: Secondary | ICD-10-CM | POA: Diagnosis not present

## 2021-12-09 DIAGNOSIS — E6609 Other obesity due to excess calories: Secondary | ICD-10-CM | POA: Diagnosis not present

## 2021-12-09 DIAGNOSIS — F3341 Major depressive disorder, recurrent, in partial remission: Secondary | ICD-10-CM | POA: Diagnosis not present

## 2021-12-09 MED ORDER — ATOMOXETINE HCL 25 MG PO CAPS: ORAL_CAPSULE | ORAL | 0 refills | Status: DC

## 2021-12-09 MED ORDER — GABAPENTIN 100 MG PO CAPS: 200.0000 mg | ORAL_CAPSULE | Freq: Two times a day (BID) | ORAL | 5 refills | Status: DC

## 2021-12-09 MED ORDER — SERTRALINE HCL 100 MG PO TABS: ORAL_TABLET | ORAL | 5 refills | Status: DC

## 2021-12-09 MED ORDER — AMPHETAMINE-DEXTROAMPHETAMINE 15 MG PO TABS: 15.0000 mg | ORAL_TABLET | Freq: Every day | ORAL | 0 refills | Status: DC

## 2021-12-09 NOTE — Progress Notes (Signed)
[facility]  Patient ID: [name],  MRN: [id number]  PCP: Wanda Plump, MD  Date of Evaluation: 12/09/2021 Time spent:20 minutes  Chief Complaint:  Chief Complaint   ADD; Follow-up    HISTORY/CURRENT STATUS: HPI For routine med check.  Adderall was started last month.  States it has helped with focus a little bit but not enough.  He still has a hard time concentrating, gets distracted easily.  He is now working [place].  His mind wanders a lot but that is much better since being on the Adderall.  He wonders if the dose can be increased.  He has been on Strattera for a couple of years and maxed out the dose, it does not seem to be working at all now.  Patient is able to enjoy things.  Energy and motivation are good.   No extreme sadness, tearfulness, or feelings of hopelessness.  Sleeps well.  ADLs and personal hygiene are normal.  Appetite has not changed.  Weight is stable.  Denies suicidal or homicidal thoughts.  Patient denies increased energy with decreased need for sleep, increased talkativeness, racing thoughts, impulsivity or risky behaviors, increased spending, increased libido, grandiosity, increased irritability or anger, paranoia, or hallucinations.  Denies dizziness, syncope, seizures, numbness, tingling, tremor, tics, unsteady gait, slurred speech, confusion. Denies muscle or joint pain, stiffness, or dystonia.   Individual Medical History/ Review of Systems: Changes? :No    Past medications for mental health diagnoses include: Lithium, BuSpar, Xanax, Seroquel, Risperdal, Depakote, Zoloft, Strattera quit working after awhile, Clonidine caused fatigue and increased depression.   Allergies: Patient has no known allergies.  Current Medications:  Current Outpatient Medications:    [START ON 12/11/2021] amphetamine-dextroamphetamine (ADDERALL) 15 MG tablet, Take 1 tablet by mouth daily., Disp: 30 tablet, Rfl:  0   atomoxetine (STRATTERA) 25 MG capsule, 2 po qd for 7 days, then 1 po qd for 7 days, then stop., Disp: 21 capsule, Rfl: 0   atorvastatin (LIPITOR) 40 MG tablet, TAKE 1 TABLET(40 MG) BY MOUTH AT BEDTIME, Disp: 90 tablet, Rfl: 3   propranolol (INDERAL) 20 MG tablet, Take 1 tablet (20 mg total) by mouth 3 (three) times daily as needed., Disp: 90 tablet, Rfl: 0   traZODone (DESYREL) 100 MG tablet, TAKE 1 TO 2 TABLETS(100 TO 200 MG) BY MOUTH AT BEDTIME AS NEEDED FOR SLEEP, Disp: 60 tablet, Rfl: 5   gabapentin (NEURONTIN) 100 MG capsule, Take 2 capsules (200 mg total) by mouth 2 (two) times daily., Disp: 120 capsule, Rfl: 5   sertraline (ZOLOFT) 100 MG tablet, TAKE 2 TABLETS(200 MG) BY MOUTH DAILY, Disp: 60 tablet, Rfl: 5   vitamin E 180 MG (400 UNITS) capsule, Take 2 capsules (800 Units total) by mouth daily. 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    General Appearance: Casual, Well Groomed and Obese  Eye Contact:  Fair  Speech:  Clear and Coherent and Normal Rate  Volume:  Normal  Mood:  Euthymic  Affect:  Congruent  Thought Process:  logical   Orientation:  Full (Time, Place, and Person)  Thought Content: Logical   Suicidal Thoughts:  No  Homicidal Thoughts:  No  Memory:  WNL  Judgement:  Good  Insight:  Good  Psychomotor Activity:  Normal  Concentration:  Concentration: Fair and Attention Span: Fair  Recall:  Good  Fund of Knowledge: Good  Language: Good  Assets:  Desire for Improvement  ADL's:  Intact  Cognition: WNL  Prognosis:  Good   DIAGNOSES:    ICD-10-CM   1. Attention deficit hyperactivity disorder (ADHD), predominantly inattentive type  F90.0     2. Recurrent major depressive disorder, in partial remission (HCC)   F33.41     3. Insomnia, unspecified type  G47.00     4. Obesity due to excess calories without serious comorbidity, unspecified classification  E66.09       Receiving Psychotherapy: No     RECOMMENDATIONS:  PDMP was reviewed.  Adderall filled 11/13/2021 I provided 20 minutes of face to face time during this encounter, including time spent before and after the visit in records review, medical decision making, counseling pertinent to today's visit, and charting.   Congratulations on his new job! Recommend increasing the Adderall to help with focus.  He agrees.  Increase Adderall to 15 mg, 1 p.o. every morning. Wean off Strattera by taking 80 mg daily for 1 week (he has at home) then 50 mg daily for 1 week then 25 mg daily for 1 week and then stop.  A prescription for 25 mg was given. Continue gabapentin 200 mg p.o. twice daily. Continue propranolol 20 mg, 1 p.o. twice daily per PCP.   Continue Zoloft 100 mg, 2 p.o. nightly. Continue trazodone 100 mg, 1-2 p.o. nightly as needed sleep. Return in 4 weeks.  [name], PA-C

## 2021-12-17 ENCOUNTER — Other Ambulatory Visit: Payer: Self-pay

## 2021-12-17 ENCOUNTER — Telehealth: Payer: Self-pay | Admitting: Physician Assistant

## 2021-12-17 MED ORDER — AMPHETAMINE-DEXTROAMPHETAMINE 15 MG PO TABS: 15.0000 mg | ORAL_TABLET | Freq: Every day | ORAL | 0 refills | Status: DC

## 2021-12-17 NOTE — Telephone Encounter (Signed)
Pt called and said that we need to cancel adderall script at [facility] because they don't have it in stock. Please send new script to the [facility] on [address]

## 2021-12-17 NOTE — Telephone Encounter (Signed)
Pended.

## 2021-12-18 ENCOUNTER — Other Ambulatory Visit: Payer: Self-pay | Admitting: Internal Medicine

## 2022-01-13 ENCOUNTER — Ambulatory Visit (INDEPENDENT_AMBULATORY_CARE_PROVIDER_SITE_OTHER): Payer: 59 | Admitting: Physician Assistant

## 2022-01-13 ENCOUNTER — Encounter: Payer: Self-pay | Admitting: Physician Assistant

## 2022-01-13 ENCOUNTER — Other Ambulatory Visit: Payer: Self-pay | Admitting: Physician Assistant

## 2022-01-13 DIAGNOSIS — F411 Generalized anxiety disorder: Secondary | ICD-10-CM | POA: Diagnosis not present

## 2022-01-13 DIAGNOSIS — G4700 Insomnia, unspecified: Secondary | ICD-10-CM | POA: Diagnosis not present

## 2022-01-13 DIAGNOSIS — E6609 Other obesity due to excess calories: Secondary | ICD-10-CM

## 2022-01-13 DIAGNOSIS — F3341 Major depressive disorder, recurrent, in partial remission: Secondary | ICD-10-CM | POA: Diagnosis not present

## 2022-01-13 DIAGNOSIS — F900 Attention-deficit hyperactivity disorder, predominantly inattentive type: Secondary | ICD-10-CM | POA: Diagnosis not present

## 2022-01-13 MED ORDER — AMPHETAMINE-DEXTROAMPHETAMINE 20 MG PO TABS: 20.0000 mg | ORAL_TABLET | Freq: Every day | ORAL | 0 refills | Status: DC

## 2022-01-13 MED ORDER — PROPRANOLOL HCL 20 MG PO TABS: 20.0000 mg | ORAL_TABLET | Freq: Three times a day (TID) | ORAL | 0 refills | Status: DC | PRN

## 2022-01-13 NOTE — Progress Notes (Signed)
[facility]  Patient ID: [name],  MRN: [id number]  PCP: Colon Branch, MD  Date of Evaluation: 01/13/2022 Time spent:20 minutes  Chief Complaint:  Chief Complaint   ADD; Depression; Anxiety; Insomnia; Follow-up    HISTORY/CURRENT STATUS: HPI For routine med check.  6 weeks ago we increased Adderall.  It helped a little bit.  Feels like it could be even more beneficial.  He had no trouble weaning off Strattera.  He is able to enjoy things.  Energy and motivation are good.  Work is going well.  Appetite is normal and weight is stable.  ADLs and personal hygiene are.  No self-harm.  Not isolating.  Not crying easily.  No suicidal or homicidal thoughts.  Since being on propranolol the anxiety has improved.  His PCP usually prescribes it but he has not been able to get in with him recently.  He will make an appointment in the next few weeks.  He does not have panic attacks or feel overly anxious now that he is on the propranolol.  He takes it twice daily routinely and sometimes a third dose and there if the anxiety is worse.  He does check his blood pressures at home and usually it is 130/80 or below, sometimes it can be 145/90 or below.  Usually it is the former.  Patient denies increased energy with decreased need for sleep, increased talkativeness, racing thoughts, impulsivity or risky behaviors, increased spending, increased libido, grandiosity, increased irritability or anger, paranoia, or hallucinations.  Denies dizziness, syncope, seizures, numbness, tingling, tremor, tics, unsteady gait, slurred speech, confusion. Denies muscle or joint pain, stiffness, or dystonia.   Individual Medical History/ Review of Systems: Changes? :No    Past medications for mental health diagnoses include: Lithium, BuSpar, Xanax, Seroquel, Risperdal, Depakote, Zoloft, Strattera quit working after awhile, Clonidine caused fatigue and increased depression.   Allergies: Patient has no  known allergies.  Current Medications:  Current Outpatient Medications:    [START ON 01/16/2022] amphetamine-dextroamphetamine (ADDERALL) 20 MG tablet, Take 1 tablet (20 mg total) by mouth daily., Disp: 30 tablet, Rfl: 0   atorvastatin (LIPITOR) 40 MG tablet, TAKE 1 TABLET(40 MG) BY MOUTH AT BEDTIME, Disp: 90 tablet, Rfl: 3   gabapentin (NEURONTIN) 100 MG capsule, Take 2 capsules (200 mg total) by mouth 2 (two) times daily., Disp: 120 capsule, Rfl: 5   sertraline (ZOLOFT) 100 MG tablet, TAKE 2 TABLETS(200 MG) BY MOUTH DAILY, Disp: 60 tablet, Rfl: 5   traZODone (DESYREL) 100 MG tablet, TAKE 1 TO 2 TABLETS(100 TO 200 MG) BY MOUTH AT BEDTIME AS NEEDED FOR SLEEP, Disp: 60 tablet, Rfl: 5   propranolol (INDERAL) 20 MG tablet, Take 1 tablet (20 mg total) by mouth 3 (three) times daily as needed., Disp: 90 tablet, Rfl: 0   vitamin E 180 MG (400 UNITS) capsule, Take 2 capsules (800 Units total) by mouth daily. 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   BP cuff gave an error.  Unable to obtain reading  General Appearance: Casual, Well Groomed and Obese  Eye Contact:  Fair  Speech:  Clear and Coherent and Normal Rate  Volume:  Normal  Mood:  Euthymic  Affect:  Congruent  Thought Process:  logical  Orientation:  Full (Time, Place, and Person)  Thought Content: Logical   Suicidal Thoughts:  No  Homicidal Thoughts:  No  Memory:  WNL  Judgement:  Good  Insight:  Good  Psychomotor Activity:  Normal  Concentration:  Concentration: Good and Attention Span: Fair  Recall:  Good  Fund of Knowledge: Good  Language: Good  Assets:  Desire for Improvement  ADL's:  Intact  Cognition: WNL  Prognosis:  Good   DIAGNOSES:    ICD-10-CM   1. Attention  deficit hyperactivity disorder (ADHD), predominantly inattentive type  F90.0     2. Insomnia, unspecified type  G47.00     3. Recurrent major depressive disorder, in partial remission ([place])  F33.41     4. Generalized anxiety disorder  F41.1     5. Obesity due to excess calories without serious comorbidity, unspecified classification  E66.09       Receiving Psychotherapy: No     RECOMMENDATIONS:  PDMP was reviewed.  Adderall filled 12/17/2021. I provided 20 minutes of face to face time during this encounter, including time spent before and after the visit in records review, medical decision making, counseling pertinent to today's visit, and charting.   Recommend increasing the Adderall.  He agrees with this plan.  Increase Adderall to 20 mg, 1 p.o. every morning. Continue gabapentin 200 mg p.o. twice daily. Continue propranolol 20 mg, 1 p.o. bid and then 1 mid day prn. (I wrote Rx this time since he has not been able to get him with his PCP.  He will get from them again after he sees them in a few weeks. Continue Zoloft 100 mg, 2 p.o. nightly. Continue trazodone 100 mg, 1/2-2 p.o. nightly as needed sleep. Return in 4 weeks.  Donnal Moat, PA-C

## 2022-02-11 ENCOUNTER — Ambulatory Visit (INDEPENDENT_AMBULATORY_CARE_PROVIDER_SITE_OTHER): Payer: 59 | Admitting: Physician Assistant

## 2022-02-11 ENCOUNTER — Encounter: Payer: Self-pay | Admitting: Physician Assistant

## 2022-02-11 VITALS — BP 121/71 | HR 75

## 2022-02-11 DIAGNOSIS — F3341 Major depressive disorder, recurrent, in partial remission: Secondary | ICD-10-CM

## 2022-02-11 DIAGNOSIS — E6609 Other obesity due to excess calories: Secondary | ICD-10-CM

## 2022-02-11 DIAGNOSIS — F900 Attention-deficit hyperactivity disorder, predominantly inattentive type: Secondary | ICD-10-CM

## 2022-02-11 DIAGNOSIS — F411 Generalized anxiety disorder: Secondary | ICD-10-CM | POA: Diagnosis not present

## 2022-02-11 MED ORDER — AMPHETAMINE-DEXTROAMPHETAMINE 30 MG PO TABS: 15.0000 mg | ORAL_TABLET | Freq: Two times a day (BID) | ORAL | 0 refills | Status: DC

## 2022-02-11 NOTE — Progress Notes (Signed)
[facility]  Patient ID: [name],  MRN: [id number]  PCP: Colon Branch, MD  Date of Evaluation: 02/11/2022 Time spent:20 minutes  Chief Complaint:  Chief Complaint   ADD; Depression; Insomnia; Follow-up    HISTORY/CURRENT STATUS: HPI For routine med check.  We increased Adderall a month ago.  It has been helpful but it just does not last long enough.  He is not sure if he wants to increase the dose or what would be most helpful.  He is more productive in the mornings, and the effects start to wane by late morning.  After lunch his productivity at work really drops. None of his supervisors have said anything, so it's not a prob there, he would like to have more focus with less distractions in the afternoon. He is glad to be working, gets him out of the house.   Patient is able to enjoy things.  Energy and motivation are good.  No extreme sadness, tearfulness, or feelings of hopelessness.  Sleeps well most of the time.  Usually takes trazodone 25 to 50 mg when he takes it.  ADLs and personal hygiene are normal.  Appetite has not changed.  Weight is stable.  Has anxiety sometimes but not severe. Takes Propranolol 1-2 times per day, which is effective.  Denies suicidal or homicidal thoughts.  Patient denies increased energy with decreased need for sleep, increased talkativeness, racing thoughts, impulsivity or risky behaviors, increased spending, increased libido, grandiosity, increased irritability or anger, paranoia, or hallucinations.  Denies dizziness, syncope, seizures, numbness, tingling, tremor, tics, unsteady gait, slurred speech, confusion. Denies muscle or joint pain, stiffness, or dystonia.   Individual Medical History/ Review of Systems: Changes? :No    Past medications for mental health diagnoses include: Lithium, BuSpar, Xanax, Seroquel, Risperdal, Depakote, Zoloft, Strattera quit working after awhile, Clonidine caused fatigue and increased depression.    Allergies: Patient has no known allergies.  Current Medications:  Current Outpatient Medications:    amphetamine-dextroamphetamine (ADDERALL) 30 MG tablet, Take 0.5 tablets by mouth 2 (two) times daily., Disp: 30 tablet, Rfl: 0   atorvastatin (LIPITOR) 40 MG tablet, TAKE 1 TABLET(40 MG) BY MOUTH AT BEDTIME, Disp: 90 tablet, Rfl: 3   gabapentin (NEURONTIN) 100 MG capsule, Take 2 capsules (200 mg total) by mouth 2 (two) times daily., Disp: 120 capsule, Rfl: 5   propranolol (INDERAL) 20 MG tablet, TAKE 1 TABLET(20 MG) BY MOUTH THREE TIMES DAILY AS NEEDED, Disp: 270 tablet, Rfl: 0   sertraline (ZOLOFT) 100 MG tablet, TAKE 2 TABLETS(200 MG) BY MOUTH DAILY, Disp: 60 tablet, Rfl: 5   traZODone (DESYREL) 100 MG tablet, TAKE 1 TO 2 TABLETS(100 TO 200 MG) BY MOUTH AT BEDTIME AS NEEDED FOR SLEEP, Disp: 60 tablet, Rfl: 5   vitamin E 180 MG (400 UNITS) capsule, Take 2 capsules (800 Units total) by mouth daily. (Patient not taking: Reported on 09/29/2021), Disp: , Rfl:  Medication Side Effects: none  Family Medical/ Social History: Changes? 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     General Appearance: Casual, Well Groomed and Obese  Eye Contact:  Good  Speech:  Clear and Coherent and Normal Rate  Volume:  Normal  Mood:  Euthymic  Affect:  Congruent  Thought Process:  logical   Orientation:  Full (Time, Place, and Person)  Thought Content: Logical   Suicidal Thoughts:  No  Homicidal  Thoughts:  No  Memory:  WNL  Judgement:  Good  Insight:  Good  Psychomotor Activity:  Normal  Concentration:  Concentration: Good and Attention Span: Fair  Recall:  Good  Fund of Knowledge: Good  Language: Good  Assets:  Desire for Improvement  ADL's:  Intact  Cognition: WNL  Prognosis:  Good   DIAGNOSES:    ICD-10-CM   1. Attention deficit hyperactivity disorder (ADHD), predominantly  inattentive type  F90.0     2. Recurrent major depressive disorder, in partial remission ([place])  F33.41     3. Generalized anxiety disorder  F41.1     4. Obesity due to excess calories without serious comorbidity, unspecified classification  E66.09      Receiving Psychotherapy: No     RECOMMENDATIONS:  PDMP was reviewed.  Adderall filled 01/17/2022. I provided 20 minutes of face to face time during this encounter, including time spent before and after the visit in records review, medical decision making, counseling pertinent to today's visit, and charting.   We discussed the Adderall.  We could leave the dose the same or increase the dose but have him split the pill and take a total of 15 mg in the morning and 15 mg around lunchtime.  He would like to try that. He is doing well otherwise so no change needs to be made.  Increase Adderall to 30 mg, 1/2 p.o. every morning and 1/2 pill around lunchtime. Continue gabapentin 200 mg p.o. twice daily. Continue propranolol 20 mg, 1 p.o. bid and then 1 mid day prn.  Continue Zoloft 100 mg, 2 p.o. nightly. Continue trazodone 100 mg, 1/4-2 p.o. nightly as needed sleep. Return in 3 months.  Donnal Moat, PA-C

## 2022-03-12 ENCOUNTER — Telehealth: Payer: Self-pay | Admitting: Physician Assistant

## 2022-03-12 ENCOUNTER — Other Ambulatory Visit: Payer: Self-pay

## 2022-03-12 NOTE — Telephone Encounter (Signed)
Pended.

## 2022-03-12 NOTE — Telephone Encounter (Signed)
LF 1/31

## 2022-03-12 NOTE — Telephone Encounter (Signed)
Pt lvm that he needs a refill on his adderall 30 mg. Pharmacy is [facility] [address].He said that they do have it in stock

## 2022-03-13 MED ORDER — AMPHETAMINE-DEXTROAMPHETAMINE 30 MG PO TABS: 15.0000 mg | ORAL_TABLET | Freq: Two times a day (BID) | ORAL | 0 refills | Status: DC

## 2022-04-13 ENCOUNTER — Telehealth: Payer: Self-pay | Admitting: Physician Assistant

## 2022-04-13 NOTE — Telephone Encounter (Signed)
Pt lvm that he needs a refill on his adderall 30 mg. Pharmacy is [facility] [address]. They do have it in stock. Next appt 4/30

## 2022-04-14 ENCOUNTER — Other Ambulatory Visit: Payer: Self-pay | Admitting: Physician Assistant

## 2022-04-14 MED ORDER — AMPHETAMINE-DEXTROAMPHETAMINE 30 MG PO TABS: 15.0000 mg | ORAL_TABLET | Freq: Two times a day (BID) | ORAL | 0 refills | Status: DC

## 2022-04-14 NOTE — Telephone Encounter (Signed)
sent 

## 2022-04-24 ENCOUNTER — Encounter: Payer: Self-pay | Admitting: Internal Medicine

## 2022-05-12 ENCOUNTER — Encounter: Payer: Self-pay | Admitting: Physician Assistant

## 2022-05-12 ENCOUNTER — Ambulatory Visit (INDEPENDENT_AMBULATORY_CARE_PROVIDER_SITE_OTHER): Payer: 59 | Admitting: Physician Assistant

## 2022-05-12 VITALS — BP 122/71 | HR 89

## 2022-05-12 DIAGNOSIS — F411 Generalized anxiety disorder: Secondary | ICD-10-CM

## 2022-05-12 DIAGNOSIS — G4700 Insomnia, unspecified: Secondary | ICD-10-CM | POA: Diagnosis not present

## 2022-05-12 DIAGNOSIS — F3342 Major depressive disorder, recurrent, in full remission: Secondary | ICD-10-CM | POA: Diagnosis not present

## 2022-05-12 DIAGNOSIS — F900 Attention-deficit hyperactivity disorder, predominantly inattentive type: Secondary | ICD-10-CM | POA: Diagnosis not present

## 2022-05-12 MED ORDER — AMPHETAMINE-DEXTROAMPHETAMINE 30 MG PO TABS: 15.0000 mg | ORAL_TABLET | Freq: Two times a day (BID) | ORAL | 0 refills | Status: DC

## 2022-05-12 MED ORDER — SERTRALINE HCL 100 MG PO TABS: ORAL_TABLET | ORAL | 5 refills | Status: DC

## 2022-05-12 MED ORDER — GABAPENTIN 100 MG PO CAPS: 200.0000 mg | ORAL_CAPSULE | Freq: Two times a day (BID) | ORAL | 5 refills | Status: DC

## 2022-05-12 NOTE — Progress Notes (Signed)
[facility]  Patient ID: [name],  MRN: [id number]  PCP: Ronald Plump, MD  Date of Evaluation: 05/12/2022 Time spent:20 minutes  Chief Complaint:  Chief Complaint   Anxiety; Depression; Insomnia; ADD; Follow-up    HISTORY/CURRENT STATUS: HPI For routine med check.  [name] is doing well. He is able to enjoy things.  Energy and motivation are good.  Work is going well. Still likes his job.  No extreme sadness, tearfulness, or feelings of hopelessness.  Sleeps well most of the time.  Does need the Trazodone.  ADLs and personal hygiene are normal.   Appetite has not changed.  Weight is stable.  Anxiety is well controlled.  He takes the propranolol routinely in the morning but at other times during the day it is as needed.  Denies suicidal or homicidal thoughts.  States that attention is good without easy distractibility.  Able to focus on things and finish tasks to completion.   Patient denies increased energy with decreased need for sleep, increased talkativeness, racing thoughts, impulsivity or risky behaviors, increased spending, increased libido, grandiosity, increased irritability or anger, paranoia, or hallucinations.  Denies dizziness, syncope, seizures, numbness, tingling, tremor, tics, unsteady gait, slurred speech, confusion. Denies muscle or joint pain, stiffness, or dystonia.   Individual Medical History/ Review of Systems: Changes? :No    Past medications for mental health diagnoses include: Lithium, BuSpar, Xanax, Seroquel, Risperdal, Depakote, Zoloft, Strattera quit working after awhile, Clonidine caused fatigue and increased depression.   Allergies: Patient has no known allergies.  Current Medications:  Current Outpatient Medications:    [START ON 06/09/2022] amphetamine-dextroamphetamine (ADDERALL) 30 MG tablet, Take 0.5 tablets by mouth 2 (two) times daily., Disp: 30 tablet, Rfl: 0   [START ON 07/10/2022] amphetamine-dextroamphetamine (ADDERALL)  30 MG tablet, Take 0.5 tablets by mouth 2 (two) times daily., Disp: 30 tablet, Rfl: 0   atorvastatin (LIPITOR) 40 MG tablet, TAKE 1 TABLET(40 MG) BY MOUTH AT BEDTIME, Disp: 90 tablet, Rfl: 3   propranolol (INDERAL) 20 MG tablet, TAKE 1 TABLET(20 MG) BY MOUTH THREE TIMES DAILY AS NEEDED, Disp: 270 tablet, Rfl: 0   traZODone (DESYREL) 100 MG tablet, TAKE 1 TO 2 TABLETS(100 TO 200 MG) BY MOUTH AT BEDTIME AS NEEDED FOR SLEEP, Disp: 60 tablet, Rfl: 5   amphetamine-dextroamphetamine (ADDERALL) 30 MG tablet, Take 0.5 tablets by mouth 2 (two) times daily., Disp: 30 tablet, Rfl: 0   gabapentin (NEURONTIN) 100 MG capsule, Take 2 capsules (200 mg total) by mouth 2 (two) times daily., Disp: 120 capsule, Rfl: 5   sertraline (ZOLOFT) 100 MG tablet, TAKE 2 TABLETS(200 MG) BY MOUTH DAILY, Disp: 60 tablet, Rfl: 5   vitamin E 180 MG (400 UNITS) capsule, Take 2 capsules (800 Units total) by mouth daily. 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     General Appearance: Casual, Well Groomed and Obese  Eye Contact:  Good  Speech:  Clear and Coherent and Normal Rate  Volume:  Normal  Mood:  Euthymic  Affect:  Congruent  Thought Process:  logical   Orientation:  Full (Time, Place, and Person)  Thought Content: Logical   Suicidal Thoughts:  No  Homicidal Thoughts:  No  Memory:  WNL  Judgement:  Good  Insight:  Good  Psychomotor  Activity:  Normal  Concentration:  Concentration: Good and Attention Span: Good  Recall:  Good  Fund of Knowledge: Good  Language: Good  Assets:  Desire for Improvement  ADL's:  Intact  Cognition: WNL  Prognosis:  Good   DIAGNOSES:    ICD-10-CM   1. Attention deficit hyperactivity disorder (ADHD), predominantly inattentive type  F90.0      2. Major depression, recurrent, full remission ([facility])  F33.42     3. Generalized anxiety disorder  F41.1     4. Insomnia, unspecified type  G47.00      Receiving Psychotherapy: No     RECOMMENDATIONS:  PDMP was reviewed.  Adderall filled for 03/03/2022.  Gabapentin filled 04/26/2022. I provided 20 minutes of face to face time during this encounter, including time spent before and after the visit in records review, medical decision making, counseling pertinent to today's visit, and charting.   Doing well so no changes are needed.   Continue Adderall   30 mg, 1/2 p.o. every morning and 1/2 pill around lunchtime. Continue gabapentin 200 mg p.o. twice daily. Continue propranolol 20 mg, 1 p.o. bid and then 1 mid day prn.  Continue Zoloft 100 mg, 2 p.o. nightly. Continue trazodone 100 mg, 1/4-2 p.o. nightly as needed sleep. Return in 6 months.  Ronald Overly, PA-C

## 2022-06-15 ENCOUNTER — Other Ambulatory Visit: Payer: Self-pay | Admitting: Physician Assistant

## 2022-06-15 ENCOUNTER — Telehealth: Payer: Self-pay | Admitting: Physician Assistant

## 2022-06-15 NOTE — Telephone Encounter (Signed)
Patient lvm today at 9:07 requesting a refill on the Adderall 30 mg. Fill at [id number] [place], [place] - [address] AT [facility] [address], [place] [address] Phone: [phone]  Fax: [phone] . Patient  stated it is available at this location.  Appointment 11/11/22 Contact # [phone]

## 2022-06-15 NOTE — Telephone Encounter (Signed)
He has a Rx there already. Have him call the pharmacy to fill it. Thanks

## 2022-08-14 ENCOUNTER — Telehealth: Payer: Self-pay | Admitting: Physician Assistant

## 2022-08-14 ENCOUNTER — Other Ambulatory Visit: Payer: Self-pay

## 2022-08-14 MED ORDER — AMPHETAMINE-DEXTROAMPHETAMINE 30 MG PO TABS: 15.0000 mg | ORAL_TABLET | Freq: Two times a day (BID) | ORAL | 0 refills | Status: DC

## 2022-08-14 NOTE — Telephone Encounter (Signed)
Pt LVM @ 12:51p requesting refill of Adderall 30mg  to   [id number] - [place], [place] - [address] AT [facility] [address] [place] [address] Phone: [phone]  Fax: [phone]   Net appt 10/30

## 2022-08-14 NOTE — Telephone Encounter (Signed)
Pended.

## 2022-09-16 ENCOUNTER — Other Ambulatory Visit: Payer: Self-pay

## 2022-09-16 ENCOUNTER — Telehealth: Payer: Self-pay | Admitting: Physician Assistant

## 2022-09-16 NOTE — Telephone Encounter (Signed)
Pt LVM @ 2:12p for refill of Adderall to   [id number] - [place],  - [address] AT [facility] [address] [place] [address] Phone: [phone]  Fax: [phone]    Next appt 10/30

## 2022-09-16 NOTE — Telephone Encounter (Signed)
Pended.

## 2022-09-17 ENCOUNTER — Telehealth: Payer: Self-pay | Admitting: Physician Assistant

## 2022-09-17 MED ORDER — AMPHETAMINE-DEXTROAMPHETAMINE 30 MG PO TABS: 15.0000 mg | ORAL_TABLET | Freq: Two times a day (BID) | ORAL | 0 refills | Status: DC

## 2022-09-17 NOTE — Telephone Encounter (Signed)
Adderall 30mg  may require a PA,

## 2022-10-03 ENCOUNTER — Telehealth: Payer: Self-pay

## 2022-10-04 NOTE — Telephone Encounter (Signed)
Prior Authorization Amphetamine-Dextroamphetamine 30MG  tablets #30/30 [facility]  Approved Effective:  10/03/22-10/03/23

## 2022-10-09 ENCOUNTER — Emergency Department (HOSPITAL_COMMUNITY)
Admission: EM | Admit: 2022-10-09 | Discharge: 2022-10-10 | Disposition: A | Discharge status: Other Acute Inpatient Hospital | Payer: 59 | Attending: Emergency Medicine | Admitting: Emergency Medicine

## 2022-10-09 ENCOUNTER — Other Ambulatory Visit: Payer: Self-pay

## 2022-10-09 ENCOUNTER — Encounter (HOSPITAL_COMMUNITY): Payer: Self-pay

## 2022-10-09 DIAGNOSIS — F411 Generalized anxiety disorder: Secondary | ICD-10-CM | POA: Diagnosis not present

## 2022-10-09 DIAGNOSIS — I10 Essential (primary) hypertension: Secondary | ICD-10-CM | POA: Insufficient documentation

## 2022-10-09 DIAGNOSIS — F988 Other specified behavioral and emotional disorders with onset usually occurring in childhood and adolescence: Secondary | ICD-10-CM | POA: Diagnosis not present

## 2022-10-09 DIAGNOSIS — F314 Bipolar disorder, current episode depressed, severe, without psychotic features: Secondary | ICD-10-CM

## 2022-10-09 DIAGNOSIS — F32A Depression, unspecified: Secondary | ICD-10-CM | POA: Insufficient documentation

## 2022-10-09 DIAGNOSIS — F3132 Bipolar disorder, current episode depressed, moderate: Secondary | ICD-10-CM | POA: Diagnosis present

## 2022-10-09 DIAGNOSIS — Z79899 Other long term (current) drug therapy: Secondary | ICD-10-CM | POA: Insufficient documentation

## 2022-10-09 DIAGNOSIS — R45851 Suicidal ideations: Secondary | ICD-10-CM | POA: Insufficient documentation

## 2022-10-09 DIAGNOSIS — F900 Attention-deficit hyperactivity disorder, predominantly inattentive type: Secondary | ICD-10-CM | POA: Diagnosis present

## 2022-10-09 HISTORY — DX: Essential (primary) hypertension: I10

## 2022-10-09 HISTORY — DX: Insomnia, unspecified: G47.00

## 2022-10-09 HISTORY — DX: Major depressive disorder, single episode, unspecified: F32.9

## 2022-10-09 HISTORY — DX: Bipolar disorder, current episode depressed, moderate: F31.32

## 2022-10-09 HISTORY — DX: Generalized anxiety disorder: F41.1

## 2022-10-09 LAB — SALICYLATE LEVEL: Salicylate Lvl: <7 mg/dL — ABNORMAL LOW (ref 7.0–30.0)

## 2022-10-09 LAB — COMPREHENSIVE METABOLIC PANEL
ALT: 23 U/L (ref 0–44)
AST: 20 U/L (ref 15–41)
Albumin: 3.7 g/dL (ref 3.5–5.0)
Alkaline Phosphatase: 51 U/L (ref 38–126)
Anion gap: 9 (ref 5–15)
BUN: 13 mg/dL (ref 6–20)
CO2: 30 mmol/L (ref 22–32)
Calcium: 9.4 mg/dL (ref 8.9–10.3)
Chloride: 98 mmol/L (ref 98–111)
Creatinine, Ser: 0.89 mg/dL (ref 0.61–1.24)
GFR, Estimated: >60 mL/min (ref >60)
Glucose, Bld: 91 mg/dL (ref 70–99)
Potassium: 4.2 mmol/L (ref 3.5–5.1)
Sodium: 137 mmol/L (ref 135–145)
Total Bilirubin: 0.7 mg/dL (ref 0.3–1.2)
Total Protein: 6.7 g/dL (ref 6.5–8.1)

## 2022-10-09 LAB — ETHANOL: Alcohol, Ethyl (B): <10 mg/dL (ref <10)

## 2022-10-09 LAB — CBC
HCT: 46 % (ref 39.0–52.0)
Hemoglobin: 15.3 g/dL (ref 13.0–17.0)
MCH: 30.4 pg (ref 26.0–34.0)
MCHC: 33.3 g/dL (ref 30.0–36.0)
MCV: 91.3 fL (ref 80.0–100.0)
Platelets: 329 10*3/uL (ref 150–400)
RBC: 5.04 MIL/uL (ref 4.22–5.81)
RDW: 12.5 % (ref 11.5–15.5)
WBC: 8.8 10*3/uL (ref 4.0–10.5)
nRBC: 0 % (ref 0.0–0.2)

## 2022-10-09 LAB — RAPID URINE DRUG SCREEN, HOSP PERFORMED
Amphetamines: POSITIVE — AB
Barbiturates: NOT DETECTED
Benzodiazepines: NOT DETECTED
Cocaine: NOT DETECTED
Opiates: NOT DETECTED
Tetrahydrocannabinol: POSITIVE — AB

## 2022-10-09 LAB — ACETAMINOPHEN LEVEL: Acetaminophen (Tylenol), Serum: <10 ug/mL — ABNORMAL LOW (ref 10–30)

## 2022-10-09 MED ORDER — GABAPENTIN 100 MG PO CAPS
200.0000 mg | ORAL_CAPSULE | Freq: Two times a day (BID) | ORAL | Status: DC
  Administered 2022-10-09 – 2022-10-10 (×2): 200 mg via ORAL
  Filled 2022-10-09 (×2): qty 2

## 2022-10-09 MED ORDER — SERTRALINE HCL 100 MG PO TABS
100.0000 mg | ORAL_TABLET | Freq: Every day | ORAL | Status: DC
  Administered 2022-10-09: 100 mg via ORAL
  Filled 2022-10-09: qty 1

## 2022-10-09 MED ORDER — AMPHETAMINE-DEXTROAMPHETAMINE 5 MG PO TABS
15.0000 mg | ORAL_TABLET | Freq: Two times a day (BID) | ORAL | Status: DC
  Administered 2022-10-09 – 2022-10-10 (×2): 15 mg via ORAL
  Filled 2022-10-09 (×2): qty 3

## 2022-10-09 MED ORDER — HYDROXYZINE HCL 25 MG PO TABS: 25.0000 mg | ORAL_TABLET | Freq: Four times a day (QID) | ORAL | Status: DC | PRN

## 2022-10-09 MED ORDER — DIAZEPAM 5 MG/ML IJ SOLN: 10.0000 mg | Freq: Four times a day (QID) | INTRAMUSCULAR | Status: DC | PRN

## 2022-10-09 MED ORDER — ZIPRASIDONE MESYLATE 20 MG IM SOLR: 10.0000 mg | Freq: Four times a day (QID) | INTRAMUSCULAR | Status: DC | PRN

## 2022-10-09 MED ORDER — SERTRALINE HCL 100 MG PO TABS
200.0000 mg | ORAL_TABLET | Freq: Every day | ORAL | Status: DC
  Administered 2022-10-10: 200 mg via ORAL
  Filled 2022-10-09: qty 2

## 2022-10-09 MED ORDER — ATORVASTATIN CALCIUM 40 MG PO TABS
40.0000 mg | ORAL_TABLET | Freq: Every day | ORAL | Status: DC
  Administered 2022-10-09 – 2022-10-10 (×2): 40 mg via ORAL
  Filled 2022-10-09 (×2): qty 1

## 2022-10-09 MED ORDER — TRAZODONE HCL 100 MG PO TABS: 100.0000 mg | ORAL_TABLET | Freq: Every evening | ORAL | Status: DC | PRN

## 2022-10-09 MED ORDER — PROPRANOLOL HCL 20 MG PO TABS
20.0000 mg | ORAL_TABLET | Freq: Three times a day (TID) | ORAL | Status: DC
  Administered 2022-10-09 – 2022-10-10 (×2): 20 mg via ORAL
  Filled 2022-10-09 (×3): qty 1
  Filled 2022-10-09: qty 2
  Filled 2022-10-09: qty 1

## 2022-10-09 MED ORDER — DIPHENHYDRAMINE HCL 50 MG/ML IJ SOLN: 50.0000 mg | Freq: Four times a day (QID) | INTRAMUSCULAR | Status: DC | PRN
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
## 2022-10-09 NOTE — BH Assessment (Addendum)
[facility] coordinator states Dr. Dolores Hoose will perform tele-psychiatric at 2300.   Pamalee Leyden, [facility], [facility] Triage Specialist

## 2022-10-09 NOTE — ED Notes (Signed)
Accepted report from off going RN; This RN has assumed care for patient; Pt arrived to unit with sitter; A&Ox4 and ambulatory w/o assistance; prior note states Mother has patient's belongings'; Patient waiting for TTS consult-Monique,RN
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
## 2022-10-09 NOTE — ED Notes (Signed)
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

## 2022-10-09 NOTE — ED Notes (Signed)
Report received from Margo Aye RN. Assumed care of pt at this time.

## 2022-10-09 NOTE — BH Assessment (Signed)
Contacted Kayla with [facility] and requested tele-psychiatry consult. Created secure message with attending staff to coordinate consult. There is currently not an estimated consult time.   Pamalee Leyden, [facility], [facility] Triage Specialist
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
## 2022-10-09 NOTE — ED Notes (Signed)
Security called to wand pt  

## 2022-10-09 NOTE — ED Notes (Signed)
TTS in process 

## 2022-10-09 NOTE — ED Notes (Signed)
Pt changing into scrubs once triaged.

## 2022-10-09 NOTE — ED Notes (Signed)
Mother of pt has pts belongings including wallet and phone. Pt agreed to this and feels comfortable doing same.

## 2022-10-10 ENCOUNTER — Inpatient Hospital Stay
Admission: AD | Admit: 2022-10-10 | Discharge: 2022-10-13 | DRG: 885 | Disposition: A | Discharge status: Home / Self Care | Payer: 59 | Source: Intra-hospital | Attending: Psychiatry | Admitting: Psychiatry

## 2022-10-10 ENCOUNTER — Encounter: Payer: Self-pay | Admitting: Psychiatry

## 2022-10-10 DIAGNOSIS — Z8042 Family history of malignant neoplasm of prostate: Secondary | ICD-10-CM

## 2022-10-10 DIAGNOSIS — Z91148 Patient's other noncompliance with medication regimen for other reason: Secondary | ICD-10-CM | POA: Diagnosis not present

## 2022-10-10 DIAGNOSIS — Z803 Family history of malignant neoplasm of breast: Secondary | ICD-10-CM

## 2022-10-10 DIAGNOSIS — Z87442 Personal history of urinary calculi: Secondary | ICD-10-CM | POA: Diagnosis not present

## 2022-10-10 DIAGNOSIS — F332 Major depressive disorder, recurrent severe without psychotic features: Secondary | ICD-10-CM | POA: Diagnosis present

## 2022-10-10 DIAGNOSIS — Z8249 Family history of ischemic heart disease and other diseases of the circulatory system: Secondary | ICD-10-CM

## 2022-10-10 DIAGNOSIS — F32A Depression, unspecified: Secondary | ICD-10-CM | POA: Diagnosis not present

## 2022-10-10 DIAGNOSIS — I10 Essential (primary) hypertension: Secondary | ICD-10-CM | POA: Diagnosis present

## 2022-10-10 DIAGNOSIS — Z823 Family history of stroke: Secondary | ICD-10-CM | POA: Diagnosis not present

## 2022-10-10 DIAGNOSIS — Z833 Family history of diabetes mellitus: Secondary | ICD-10-CM

## 2022-10-10 DIAGNOSIS — G4700 Insomnia, unspecified: Secondary | ICD-10-CM | POA: Diagnosis present

## 2022-10-10 DIAGNOSIS — Z9151 Personal history of suicidal behavior: Secondary | ICD-10-CM

## 2022-10-10 DIAGNOSIS — R45851 Suicidal ideations: Secondary | ICD-10-CM | POA: Diagnosis present

## 2022-10-10 DIAGNOSIS — Z79899 Other long term (current) drug therapy: Secondary | ICD-10-CM | POA: Diagnosis not present

## 2022-10-10 DIAGNOSIS — F411 Generalized anxiety disorder: Secondary | ICD-10-CM | POA: Diagnosis present

## 2022-10-10 MED ORDER — LORAZEPAM 2 MG/ML IJ SOLN: 2.0000 mg | Freq: Three times a day (TID) | INTRAMUSCULAR | Status: DC | PRN

## 2022-10-10 MED ORDER — ALUM & MAG HYDROXIDE-SIMETH 200-200-20 MG/5ML PO SUSP: 30.0000 mL | ORAL | Status: DC | PRN

## 2022-10-10 MED ORDER — DIPHENHYDRAMINE HCL 25 MG PO CAPS: 50.0000 mg | ORAL_CAPSULE | Freq: Three times a day (TID) | ORAL | Status: DC | PRN

## 2022-10-10 MED ORDER — HALOPERIDOL 5 MG PO TABS: 5.0000 mg | ORAL_TABLET | Freq: Three times a day (TID) | ORAL | Status: DC | PRN

## 2022-10-10 MED ORDER — ACETAMINOPHEN 325 MG PO TABS: 650.0000 mg | ORAL_TABLET | Freq: Four times a day (QID) | ORAL | Status: DC | PRN

## 2022-10-10 MED ORDER — LORAZEPAM 2 MG PO TABS: 2.0000 mg | ORAL_TABLET | Freq: Three times a day (TID) | ORAL | Status: DC | PRN

## 2022-10-10 MED ORDER — TRAZODONE HCL 50 MG PO TABS
50.0000 mg | ORAL_TABLET | Freq: Every evening | ORAL | Status: DC | PRN
  Administered 2022-10-10 – 2022-10-11 (×2): 50 mg via ORAL
  Filled 2022-10-10 (×2): qty 1

## 2022-10-10 MED ORDER — HYDROXYZINE HCL 25 MG PO TABS: 25.0000 mg | ORAL_TABLET | Freq: Three times a day (TID) | ORAL | Status: DC | PRN

## 2022-10-10 MED ORDER — HALOPERIDOL LACTATE 5 MG/ML IJ SOLN: 5.0000 mg | Freq: Three times a day (TID) | INTRAMUSCULAR | Status: DC | PRN

## 2022-10-10 MED ORDER — DIPHENHYDRAMINE HCL 50 MG/ML IJ SOLN: 50.0000 mg | Freq: Three times a day (TID) | INTRAMUSCULAR | Status: DC | PRN

## 2022-10-10 MED ORDER — MAGNESIUM HYDROXIDE 400 MG/5ML PO SUSP: 30.0000 mL | Freq: Every day | ORAL | Status: DC | PRN

## 2022-10-10 NOTE — Progress Notes (Addendum)
ED nursing requested that pt transport in the morning.    Pt was accepted to [facility]  10/10/2022; Bed Assignment 309 PENDING Signed voluntary consent Uploaded to chart or faxed to [facility] FAX Number [phone]. Address: [address] [place], [place], [place] [address]   Pt meets inpatient criteria per Ezekiel Slocumb, MD Telepsychiatry Consult Services  Attending Physician will be Dr. Shellee Milo  Report can be called to: [phone]  Pt can arrive after: [facility] to coordinate with care team.  Care Team notified: Night [facility] Fransico Michael, RN, Adonis Brook, RN, Johnnette Gourd, Fredrich Romans, RN, Gerrit Halls, RN, Doyce Para, RN, Clare Gandy, Meta Hatchet, [place]  Maryjean Ka, MSW, [facility] 10/10/2022 1:41 AM

## 2022-10-10 NOTE — ED Provider Notes (Signed)
Emergency Medicine Observation Re-evaluation Note  [name] is a 27 y.o. male, seen on rounds today.  Pt initially presented to the ED for complaints of Psychiatric Evaluation Currently, the patient is calm, aware that he will be admitted to [facility].  Physical Exam  BP 137/65   Pulse 68   Temp 99.3 F (37.4 C) (Oral)   Resp 18   Ht 6' (1.829 m)   Wt 116.6 kg   SpO2 100%   BMI 34.86 kg/m  Physical Exam General: No acute distress Cardiac: Regular rate Lungs: No respiratory distress Psych: Currently calm  ED Course / MDM  EKG:   I have reviewed the labs performed to date as well as medications administered while in observation.  Recent changes in the last 24 hours include no new changes.  Patient accepted to [facility] for psychiatric stabilization.  Plan  Current plan is for transfer to [facility].    Derwood Kaplan, MD 10/10/22 1128

## 2022-10-10 NOTE — Progress Notes (Signed)
Admission note:  Consents signed, literature detailing the patient's rights, responsibilities, and visitor guidelines provided. Skin search performed with small tattoo on right thigh, otherwise unremarkable. Belongings search completed with no contraband found. Patient denies SI at present as well as HI/AVH. He endorses depression and anxiety. He states that he was working so much and traveling that he "just would forget and stopped his medications". He states that is his goal while here is to "get his medications straight".and He was oriented to his room and the unit. No questions or concerns at present.He contracts for safety and remains safe on the unit at this time.

## 2022-10-10 NOTE — Tx Team (Signed)
Initial Treatment Plan 10/10/2022 2:21 PM [name] UEA:[id number]    PATIENT STRESSORS: Medication change or noncompliance   Occupational concerns     PATIENT STRENGTHS: Ability for insight  Average or above average intelligence  Capable of independent living  Communication skills  Financial means  General fund of knowledge  Motivation for treatment/growth  Physical Health  Supportive family/friends  Work skills    PATIENT IDENTIFIED PROBLEMS: Work stressors  Medication non compliance  Depression                 DISCHARGE CRITERIA:  Ability to meet basic life and health needs Adequate post-discharge living arrangements Improved stabilization in mood, thinking, and/or behavior Verbal commitment to aftercare and medication compliance  PRELIMINARY DISCHARGE PLAN: Outpatient therapy Return to previous living arrangement Return to previous work or school arrangements  PATIENT/FAMILY INVOLVEMENT: This treatment plan has been presented to and reviewed with the patient, [name].  The patient and family have been given the opportunity to ask questions and make suggestions.  Delos Haring, RN 10/10/2022, 2:21 PM

## 2022-10-10 NOTE — Group Note (Signed)
Date:  10/10/2022 Time:  8:31 PM  Group Topic/Focus:  Goals Group:   The focus of this group is to help patients establish daily goals to achieve during treatment and discuss how the patient can incorporate goal setting into their daily lives to aide in recovery.    Participation Level:  Did Not Attend  Participation Quality:   Did Not Attend  Affect:   Did Not Attend  Cognitive:   Did Not Attend  Insight: None  Engagement in Group:  None  Modes of Intervention:  Socialization  Additional Comments:    Garry Heater 10/10/2022, 8:31 PM

## 2022-10-10 NOTE — Group Note (Signed)
Date:  10/10/2022 Time:  6:12 PM  Group Topic/Focus:  Activity Group:  The focus of the group is to promote activity for the patients to get some exercise and some fresh air outside in the courtyard.   Participation Level:  Did Not Attend   Mary Sella Angelyse Heslin 10/10/2022, 6:12 PM

## 2022-10-10 NOTE — ED Notes (Signed)
Safety transport called for pt transfer to [facility], will call when present to ED.

## 2022-10-11 ENCOUNTER — Other Ambulatory Visit: Payer: Self-pay

## 2022-10-11 DIAGNOSIS — F332 Major depressive disorder, recurrent severe without psychotic features: Secondary | ICD-10-CM | POA: Diagnosis not present

## 2022-10-11 MED ORDER — SERTRALINE HCL 25 MG PO TABS
50.0000 mg | ORAL_TABLET | Freq: Every day | ORAL | Status: DC
  Administered 2022-10-11 – 2022-10-13 (×3): 50 mg via ORAL
  Filled 2022-10-11 (×3): qty 2

## 2022-10-11 MED ORDER — GABAPENTIN 100 MG PO CAPS
200.0000 mg | ORAL_CAPSULE | Freq: Two times a day (BID) | ORAL | Status: DC
  Administered 2022-10-12 – 2022-10-13 (×3): 200 mg via ORAL
  Filled 2022-10-11 (×4): qty 2

## 2022-10-11 NOTE — Progress Notes (Signed)
D- Patient alert and oriented.x4,  Affect/mood, sad and depressed, denies SI, HI, or intent, Pt. Also, denied AVH, and pain. Pt was visible in the milieu but, minimum interactions noted. A- Scheduled medications administered to patient, per MD orders. Support and encouragement provided.  Routine safety checks conducted every 15 minutes.  Patient informed to notify staff with problems or concerns. R- No adverse drug reactions noted. Patient contracts for safety at this time. Patient compliant with medications and treatment plan. Patient receptive, calm, and cooperative. Patient interacts well with others on the unit.  Patient remains safe at this time.

## 2022-10-11 NOTE — Plan of Care (Signed)
  Problem: Education: Goal: Knowledge of General Education information will improve Description: Including pain rating scale, medication(s)/side effects and non-pharmacologic comfort measures Outcome: Not Progressing   Problem: Health Behavior/Discharge Planning: Goal: Ability to manage health-related needs will improve Outcome: Not Progressing   Problem: Clinical Measurements: Goal: Ability to maintain clinical measurements within normal limits will improve Outcome: Not Progressing Goal: Will remain free from infection Outcome: Not Progressing Goal: Diagnostic test results will improve Outcome: Not Progressing Goal: Respiratory complications will improve Outcome: Not Progressing Goal: Cardiovascular complication will be avoided Outcome: Not Progressing   Problem: Activity: Goal: Risk for activity intolerance will decrease Outcome: Not Progressing   Problem: Nutrition: Goal: Adequate nutrition will be maintained Outcome: Not Progressing   Problem: Coping: Goal: Level of anxiety will decrease Outcome: Not Progressing   Problem: Elimination: Goal: Will not experience complications related to bowel motility Outcome: Not Progressing Goal: Will not experience complications related to urinary retention Outcome: Not Progressing   Problem: Pain Managment: Goal: General experience of comfort will improve Outcome: Not Progressing   Problem: Safety: Goal: Ability to remain free from injury will improve Outcome: Not Progressing   Problem: Skin Integrity: Goal: Risk for impaired skin integrity will decrease Outcome: Not Progressing   Problem: Education: Goal: Knowledge of [place] [facility] Education information/materials will improve Outcome: Not Progressing Goal: Emotional status will improve Outcome: Not Progressing Goal: Mental status will improve Outcome: Not Progressing Goal: Verbalization of understanding the information provided will improve Outcome: Not  Progressing   Problem: Activity: Goal: Interest or engagement in activities will improve Outcome: Not Progressing Goal: Sleeping patterns will improve Outcome: Not Progressing   Problem: Coping: Goal: Ability to verbalize frustrations and anger appropriately will improve Outcome: Not Progressing Goal: Ability to demonstrate self-control will improve Outcome: Not Progressing   Problem: Health Behavior/Discharge Planning: Goal: Identification of resources available to assist in meeting health care needs will improve Outcome: Not Progressing Goal: Compliance with treatment plan for underlying cause of condition will improve Outcome: Not Progressing   Problem: Physical Regulation: Goal: Ability to maintain clinical measurements within normal limits will improve Outcome: Not Progressing   Problem: Safety: Goal: Periods of time without injury will increase Outcome: Not Progressing   Problem: Education: Goal: Ability to make informed decisions regarding treatment will improve Outcome: Not Progressing   Problem: Coping: Goal: Coping ability will improve Outcome: Not Progressing   Problem: Health Behavior/Discharge Planning: Goal: Identification of resources available to assist in meeting health care needs will improve Outcome: Not Progressing   Problem: Medication: Goal: Compliance with prescribed medication regimen will improve Outcome: Not Progressing   Problem: Self-Concept: Goal: Ability to disclose and discuss suicidal ideas will improve Outcome: Not Progressing Goal: Will verbalize positive feelings about self Outcome: Not Progressing Note:     Problem: Education: Goal: Utilization of techniques to improve thought processes will improve Outcome: Not Progressing

## 2022-10-11 NOTE — Group Note (Signed)
[facility] LCSW Group Therapy Note   Group Date: 10/11/2022 Start Time: 1300 End Time: 1340   Type of Therapy/Topic:  Group Therapy:  Balance in Life  Participation Level:  Did Not Attend   Description of Group:    This group will address the concept of balance and how it feels and looks when one is unbalanced. Patients will be encouraged to process areas in their lives that are out of balance, and identify reasons for remaining unbalanced. Facilitators will guide patients utilizing problem- solving interventions to address and correct the stressor making their life unbalanced. Understanding and applying boundaries will be explored and addressed for obtaining  and maintaining a balanced life. Patients will be encouraged to explore ways to assertively make their unbalanced needs known to significant others in their lives, using other group members and facilitator for support and feedback.  Therapeutic Goals: Patient will identify two or more emotions or situations they have that consume much of in their lives. Patient will identify signs/triggers that life has become out of balance:  Patient will identify two ways to set boundaries in order to achieve balance in their lives:  Patient will demonstrate ability to communicate their needs through discussion and/or role plays  Summary of Patient Progress:   The patient did not attend group.        Marshell Levan, LCSW

## 2022-10-11 NOTE — Progress Notes (Signed)
D- Patient alert and oriented x 4. Affect flat/mood congruent. Denies SI/ HI/ AVH. Patient denies pain. Patient endorses depression and anxiety. He began Zoloft 50 mg today. He was instructed on medication- actions, dose and adverse reaction. He states that this was a medication he took prior to admission but had stopped taking, "forgetting to take". A- Scheduled medications administered to patient, per MD orders. Support and encouragement provided.  Routine safety checks conducted every 15 minutes without incident.  Patient informed to notify staff with problems or concerns and verbalizes understanding. R- No adverse drug reactions noted.  Patient compliant with medications and treatment plan. Patient receptive, calm and cooperative. He was watching TV in the dayroom this afternoon but had been isolative to his room prior to.  Patient contracts for safety and  remains safe on the unit at this time.

## 2022-10-11 NOTE — Group Note (Signed)
Date:  10/11/2022 Time:  3:24 PM  Group Topic/Focus:  Activity Group:  The focus of the group to promote activity for the patients to go outside in the courtyard and get some exercise and some fresh air.    Participation Level:  Did Not Attend   Mary Sella Arliene Rosenow 10/11/2022, 3:24 PM

## 2022-10-11 NOTE — Progress Notes (Deleted)
D- Patient alert and oriented. Affect/mood. Denies SI/ HI/ AVH. Patient denies pain. Patient endorses depression and anxiety. Goal of the day. A- Scheduled medications administered to patient, per MD orders. Support and encouragement provided.  Routine safety checks conducted every 15 minutes without incident.  Patient informed to notify staff with problems or concerns and verbalizes understanding. R- No adverse drug reactions noted.  Patient compliant with medications and treatment plan. Patient receptive, calm cooperative and interacts well with others on the unit.  Patient contracts for safety and  remains safe on the unit at this time. 
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
## 2022-10-11 NOTE — Plan of Care (Signed)
?  Problem: Education: ?Goal: Knowledge of General Education information will improve ?Description: Including pain rating scale, medication(s)/side effects and non-pharmacologic comfort measures ?Outcome: Progressing ?  ?Problem: Health Behavior/Discharge Planning: ?Goal: Ability to manage health-related needs will improve ?Outcome: Progressing ?  ?Problem: Clinical Measurements: ?Goal: Ability to maintain clinical measurements within normal limits will improve ?Outcome: Progressing ?  ?Problem: Activity: ?Goal: Risk for activity intolerance will decrease ?Outcome: Progressing ?  ?Problem: Coping: ?Goal: Level of anxiety will decrease ?Outcome: Progressing ?  ?Problem: Pain Managment: ?Goal: General experience of comfort will improve ?Outcome: Progressing ?  ?

## 2022-10-11 NOTE — [facility] Suicide Risk Assessment (Signed)
[facility] Admission Suicide Risk Assessment   Nursing information obtained from:  Patient Demographic factors:  Male, Adolescent or young adult, Caucasian Current Mental Status:  NA Loss Factors:  NA Historical Factors:  Prior suicide attempts Risk Reduction Factors:  Sense of responsibility to family, Employed, Living with another person, especially a relative, Positive social support, Positive coping skills or problem solving skills  Total Time spent with patient: 1 hour Principal Problem: MDD (major depressive disorder), recurrent severe, without psychosis (HCC) Diagnosis:  Principal Problem:   MDD (major depressive disorder), recurrent severe, without psychosis (HCC)  Subjective Data: "Thursday and Friday I felt I didn't have control over myself and thought I was going to hurt myself."  Continued Clinical Symptoms:  Alcohol Use Disorder Identification Test Final Score (AUDIT): 1 The "Alcohol Use Disorders Identification Test", Guidelines for Use in Primary Care, Second Edition.  World Science writer [facility]). Score between 0-7:  no or low risk or alcohol related problems. Score between 8-15:  moderate risk of alcohol related problems. Score between 16-19:  high risk of alcohol related problems. Score 20 or above:  warrants further diagnostic evaluation for alcohol dependence and treatment.   CLINICAL FACTORS:   Depression:   Severe   Musculoskeletal: Strength & Muscle Tone: within normal limits Gait & Station: normal Patient leans: N/A  Psychiatric Specialty Exam: Physical Exam Vitals and nursing note reviewed.  Constitutional:      Appearance: Normal appearance.  HENT:     Head: Normocephalic.     Nose: Nose normal.  Pulmonary:     Effort: Pulmonary effort is normal.  Musculoskeletal:        General: Normal range of motion.     Cervical back: Normal range of motion.  Neurological:     General: No focal deficit present.     Mental Status: He is alert and oriented to  person, place, and time.     Review of Systems  Psychiatric/Behavioral:  Positive for depression. The patient is nervous/anxious.   All other systems reviewed and are negative.   Blood pressure (!) 133/91, pulse 61, temperature (!) 96.8 F (36 C), resp. rate 16, height 5\' 11"  (1.803 m), weight 109.8 kg, SpO2 98%.Body mass index is 33.75 kg/m.  General Appearance: Casual  Eye Contact:  Fair  Speech:  Normal Rate  Volume:  Normal  Mood:  Anxious and Depressed  Affect:  Congruent  Thought Process:  Coherent and Descriptions of Associations: Intact  Orientation:  Full (Time, Place, and Person)  Thought Content:  WDL and Logical  Suicidal Thoughts:  No  Homicidal Thoughts:  No  Memory:  Immediate;   Fair Recent;   Fair Remote;   Fair  Judgement:  Fair  Insight:  Fair  Psychomotor Activity:  Normal  Concentration:  Concentration: Fair and Attention Span: Fair  Recall:  Fair  Fund of Knowledge:  Good  Language:  Good  Akathisia:  No  Handed:  Right  AIMS (if indicated):     Assets:  Housing Leisure Time Physical Health Resilience Social Support  ADL's:  Intact  Cognition:  WNL  Sleep:         Physical Exam: Physical Exam Vitals and nursing note reviewed.  Constitutional:      Appearance: Normal appearance.  HENT:     Head: Normocephalic.     Nose: Nose normal.  Pulmonary:     Effort: Pulmonary effort is normal.  Musculoskeletal:        General: Normal range of motion.  Cervical back: Normal range of motion.  Neurological:     General: No focal deficit present.     Mental Status: He is alert and oriented to person, place, and time.    Review of Systems  Psychiatric/Behavioral:  Positive for depression. The patient is nervous/anxious.   All other systems reviewed and are negative.  Blood pressure (!) 133/91, pulse 61, temperature (!) 96.8 F (36 C), resp. rate 16, height 5\' 11"  (1.803 m), weight 109.8 kg, SpO2 98%. Body mass index is 33.75  kg/m.   COGNITIVE FEATURES THAT CONTRIBUTE TO RISK:  None    SUICIDE RISK:   Mild:  Suicidal ideation of limited frequency, intensity, duration, and specificity.  There are no identifiable plans, no associated intent, mild dysphoria and related symptoms, good self-control (both objective and subjective assessment), few other risk factors, and identifiable protective factors, including available and accessible social support.  PLAN OF CARE:  Major depressive disorder, recurrent, severe without psychosis: Zoloft 50 mg daily  General anxiety disorder: Gabapentin 200 mg BID  Insomnia: Trazodone 50 mg daily at bedtime PRN  I certify that inpatient services furnished can reasonably be expected to improve the patient's condition.   Nanine Means, NP 10/11/2022, 10:54 AM
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
## 2022-10-12 DIAGNOSIS — F332 Major depressive disorder, recurrent severe without psychotic features: Principal | ICD-10-CM

## 2022-10-12 NOTE — Group Note (Signed)
Recreation Therapy Group Note   Group Topic:General Recreation  Group Date: 10/12/2022 Start Time: 1040 End Time: 1130 Facilitators: Rosina Lowenstein, LRT, CTRS Location: Courtyard  Group Description: Outdoor Recreation. Patients had the option to play basketball, corn hole, or draw with chalk and listen to music while outside in the courtyard getting fresh air and sunlight. LRT and pts discussed things that they enjoy doing in their free time outside of the hospital.   Goal Area(s) Addressed: Patient will identify leisure interests.  Patient will practice healthy decision making. Patient will engage in recreation activity.   Affect/Mood: Appropriate   Participation Level: Active   Participation Quality: Independent   Behavior: Calm and Cooperative   Speech/Thought Process: Coherent   Insight: Fair   Judgement: Fair    Modes of Intervention: Activity   Patient Response to Interventions:  Receptive   Education Outcome:  Acknowledges education   Clinical Observations/Individualized Feedback: Carlo was active in their participation of session activities and group discussion. Pt played corn hole with peers while outside. Pt interacted well with LRT and peers duration of session.   Plan: Continue to engage patient in RT group sessions 2-3x/week.   Rosina Lowenstein, LRT, CTRS 10/12/2022 11:56 AM

## 2022-10-12 NOTE — Progress Notes (Signed)
Patient pleasant and cooperative with care. Denies SI, HI, AVH. Appropriate with staff and peers. Early part of shift in dayroom. Requested no prns for sleep. Reports he would like to be discharged.  Encouragement and support provided. Safety checks maintained. Medications given as prescribed. Pt receptive and remains safe on unit with q 15 min checks.

## 2022-10-12 NOTE — Plan of Care (Signed)
?  Problem: Education: Goal: Knowledge of General Education information will improve Description: Including pain rating scale, medication(s)/side effects and non-pharmacologic comfort measures Outcome: Progressing   Problem: Health Behavior/Discharge Planning: Goal: Ability to manage health-related needs will improve Outcome: Progressing   Problem: Clinical Measurements: Goal: Ability to maintain clinical measurements within normal limits will improve Outcome: Progressing Goal: Will remain free from infection Outcome: Progressing Goal: Diagnostic test results will improve Outcome: Progressing Goal: Respiratory complications will improve Outcome: Progressing Goal: Cardiovascular complication will be avoided Outcome: Progressing   Problem: Activity: Goal: Risk for activity intolerance will decrease Outcome: Progressing   Problem: Nutrition: Goal: Adequate nutrition will be maintained Outcome: Progressing   Problem: Coping: Goal: Level of anxiety will decrease Outcome: Progressing   Problem: Elimination: Goal: Will not experience complications related to bowel motility Outcome: Progressing Goal: Will not experience complications related to urinary retention Outcome: Progressing   Problem: Elimination: Goal: Will not experience complications related to bowel motility Outcome: Progressing Goal: Will not experience complications related to urinary retention Outcome: Progressing   

## 2022-10-12 NOTE — BH IP Treatment Plan (Signed)
Interdisciplinary Treatment and Diagnostic Plan Update  10/12/2022 Time of Session: 10:26 AM  [name] MRN: [id number]  Principal Diagnosis: MDD (major depressive disorder), recurrent severe, without psychosis (HCC)  Secondary Diagnoses: Principal Problem:   MDD (major depressive disorder), recurrent severe, without psychosis (HCC)   Current Medications:  Current Facility-Administered Medications  Medication Dose Route Frequency Provider Last Rate Last Admin   acetaminophen (TYLENOL) tablet 650 mg  650 mg Oral Q6H PRN Onuoha, Chinwendu V, NP       alum & mag hydroxide-simeth (MAALOX/MYLANTA) 200-200-20 MG/5ML suspension 30 mL  30 mL Oral Q4H PRN Onuoha, Chinwendu V, NP       diphenhydrAMINE (BENADRYL) capsule 50 mg  50 mg Oral TID PRN Onuoha, Chinwendu V, NP       Or   diphenhydrAMINE (BENADRYL) injection 50 mg  50 mg Intramuscular TID PRN Onuoha, Chinwendu V, NP       gabapentin (NEURONTIN) capsule 200 mg  200 mg Oral BID Charm Rings, NP   200 mg at 10/12/22 0865   haloperidol (HALDOL) tablet 5 mg  5 mg Oral TID PRN Onuoha, Chinwendu V, NP       Or   haloperidol lactate (HALDOL) injection 5 mg  5 mg Intramuscular TID PRN Onuoha, Chinwendu V, NP       hydrOXYzine (ATARAX) tablet 25 mg  25 mg Oral TID PRN Onuoha, Chinwendu V, NP       LORazepam (ATIVAN) tablet 2 mg  2 mg Oral TID PRN Onuoha, Chinwendu V, NP       Or   LORazepam (ATIVAN) injection 2 mg  2 mg Intramuscular TID PRN Onuoha, Chinwendu V, NP       magnesium hydroxide (MILK OF MAGNESIA) suspension 30 mL  30 mL Oral Daily PRN Onuoha, Chinwendu V, NP       sertraline (ZOLOFT) tablet 50 mg  50 mg Oral Daily Charm Rings, NP   50 mg at 10/12/22 7846   traZODone (DESYREL) tablet 50 mg  50 mg Oral QHS PRN Onuoha, Chinwendu V, NP   50 mg at 10/11/22 2114   PTA Medications: Medications Prior to Admission  Medication Sig Dispense Refill Last Dose   amphetamine-dextroamphetamine (ADDERALL) 30 MG tablet Take 0.5  tablets by mouth 2 (two) times daily. 30 tablet 0    [START ON 11/12/2022] amphetamine-dextroamphetamine (ADDERALL) 30 MG tablet Take 0.5 tablets by mouth 2 (two) times daily. 30 tablet 0    [START ON 10/15/2022] amphetamine-dextroamphetamine (ADDERALL) 30 MG tablet Take 0.5 tablets by mouth 2 (two) times daily. 30 tablet 0    atorvastatin (LIPITOR) 40 MG tablet TAKE 1 TABLET(40 MG) BY MOUTH AT BEDTIME 90 tablet 3    gabapentin (NEURONTIN) 100 MG capsule Take 2 capsules (200 mg total) by mouth 2 (two) times daily. 120 capsule 5    propranolol (INDERAL) 20 MG tablet TAKE 1 TABLET(20 MG) BY MOUTH THREE TIMES DAILY AS NEEDED 270 tablet 0    sertraline (ZOLOFT) 100 MG tablet TAKE 2 TABLETS(200 MG) BY MOUTH DAILY 60 tablet 5    traZODone (DESYREL) 100 MG tablet TAKE 1 TO 2 TABLETS(100 TO 200 MG) BY MOUTH AT BEDTIME AS NEEDED FOR SLEEP 60 tablet 5    vitamin E 180 MG (400 UNITS) capsule Take 2 capsules (800 Units total) by mouth daily. (Patient not taking: Reported on 09/29/2021)       Patient Stressors: Medication change or noncompliance   Occupational concerns    Patient Strengths: Ability  for insight  Average or above average intelligence  Capable of independent living  Communication skills  Financial means  General fund of knowledge  Motivation for treatment/growth  Physical Health  Supportive family/friends  Work skills   Treatment Modalities: Medication Management, Group therapy, Case management,  1 to 1 session with clinician, Psychoeducation, Recreational therapy.   Physician Treatment Plan for Primary Diagnosis: MDD (major depressive disorder), recurrent severe, without psychosis (HCC) Long Term Goal(s): Improvement in symptoms so as ready for discharge   Short Term Goals: Ability to identify changes in lifestyle to reduce recurrence of condition will improve Ability to verbalize feelings will improve Ability to disclose and discuss suicidal ideas Ability to demonstrate  self-control will improve Ability to identify and develop effective coping behaviors will improve Ability to maintain clinical measurements within normal limits will improve Compliance with prescribed medications will improve Ability to identify triggers associated with substance abuse/mental health issues will improve  Medication Management: Evaluate patient's response, side effects, and tolerance of medication regimen.  Therapeutic Interventions: 1 to 1 sessions, Unit Group sessions and Medication administration.  Evaluation of Outcomes: Progressing  Physician Treatment Plan for Secondary Diagnosis: Principal Problem:   MDD (major depressive disorder), recurrent severe, without psychosis (HCC)  Long Term Goal(s): Improvement in symptoms so as ready for discharge   Short Term Goals: Ability to identify changes in lifestyle to reduce recurrence of condition will improve Ability to verbalize feelings will improve Ability to disclose and discuss suicidal ideas Ability to demonstrate self-control will improve Ability to identify and develop effective coping behaviors will improve Ability to maintain clinical measurements within normal limits will improve Compliance with prescribed medications will improve Ability to identify triggers associated with substance abuse/mental health issues will improve     Medication Management: Evaluate patient's response, side effects, and tolerance of medication regimen.  Therapeutic Interventions: 1 to 1 sessions, Unit Group sessions and Medication administration.  Evaluation of Outcomes: Progressing   RN Treatment Plan for Primary Diagnosis: MDD (major depressive disorder), recurrent severe, without psychosis (HCC) Long Term Goal(s): Knowledge of disease and therapeutic regimen to maintain health will improve  Short Term Goals: Ability to remain free from injury will improve, Ability to verbalize frustration and anger appropriately will improve,  Ability to demonstrate self-control, Ability to participate in decision making will improve, Ability to verbalize feelings will improve, Ability to disclose and discuss suicidal ideas, Ability to identify and develop effective coping behaviors will improve, and Compliance with prescribed medications will improve  Medication Management: RN will administer medications as ordered by provider, will assess and evaluate patient's response and provide education to patient for prescribed medication. RN will report any adverse and/or side effects to prescribing provider.  Therapeutic Interventions: 1 on 1 counseling sessions, Psychoeducation, Medication administration, Evaluate responses to treatment, Monitor vital signs and CBGs as ordered, Perform/monitor CIWA, COWS, AIMS and Fall Risk screenings as ordered, Perform wound care treatments as ordered.  Evaluation of Outcomes: Progressing   LCSW Treatment Plan for Primary Diagnosis: MDD (major depressive disorder), recurrent severe, without psychosis (HCC) Long Term Goal(s): Safe transition to appropriate next level of care at discharge, Engage patient in therapeutic group addressing interpersonal concerns.  Short Term Goals: Engage patient in aftercare planning with referrals and resources, Increase social support, Increase ability to appropriately verbalize feelings, Increase emotional regulation, Facilitate acceptance of mental health diagnosis and concerns, Facilitate patient progression through stages of change regarding substance use diagnoses and concerns, Identify triggers associated with mental health/substance abuse issues, and  Increase skills for wellness and recovery  Therapeutic Interventions: Assess for all discharge needs, 1 to 1 time with Social worker, Explore available resources and support systems, Assess for adequacy in community support network, Educate family and significant other(s) on suicide prevention, Complete Psychosocial Assessment,  Interpersonal group therapy.  Evaluation of Outcomes: Progressing   Progress in Treatment: Attending groups: No. Participating in groups: No. Taking medication as prescribed: Yes. Toleration medication: Yes. Family/Significant other contact made: No, will contact:  CSW will assist with appropriate discharge planning  Patient understands diagnosis: Yes. Discussing patient identified problems/goals with staff: Yes. Medical problems stabilized or resolved: Yes. Denies suicidal/homicidal ideation: Yes. Issues/concerns per patient self-inventory: No. Other: None   New problem(s) identified: No, Describe:  None identified   New Short Term/Long Term Goal(s): , elimination of symptoms of psychosis, medication management for mood stabilization; elimination of SI thoughts; development of comprehensive mental wellness plan.   Patient Goals:  " Really just when I get out rely more on people like my parents. I wanted to get my meds started."   Discharge Plan or Barriers: CSW will assist with appropriate discharge planning   Reason for Continuation of Hospitalization: Depression Medication stabilization  Estimated Length of Stay: 1 to 7 days   Last 3 [place] Suicide Severity Risk Score: Flowsheet Row Admission (Current) from 10/10/2022 in [facility] INPATIENT BE[facility]ICINE ED from 10/09/2022 in [facility] Department at [facility]  C-SSRS RISK CATEGORY Low Risk High Risk       Last [facility] 2/9 Scores:    02/18/2021    9:22 AM 10/22/2020    9:58 AM 04/22/2020   11:02 AM  Depression screen PHQ 2/9  Decreased Interest 0 0 1  Down, Depressed, Hopeless 0 0 1  PHQ - 2 Score 0 0 2  Altered sleeping 1 1 3   Tired, decreased energy 0 1 1  Change in appetite 0 1 3  Feeling bad or failure about yourself  0 1 0  Trouble concentrating 2 2 1   Moving slowly or fidgety/restless 1 0 1  Suicidal thoughts 0 0 0  PHQ-9 Score 4 6 11   Difficult doing work/chores Somewhat difficult   Somewhat difficult    Scribe for Treatment Team: Elza Rafter, [place] 10/12/2022 10:46 AM

## 2022-10-12 NOTE — Progress Notes (Signed)
Nursing Shift Note:  1900-0700  Attended Evening Group: yes Medication Compliant:  n/a Behavior: isolative Sleep Quality: good Significant Changes: none noted   10/12/22 0000  Psych Admission Type (Psych Patients Only)  Admission Status Voluntary  Psychosocial Assessment  Patient Complaints Anxiety  Eye Contact Fair  Facial Expression Anxious  Affect Appropriate to circumstance  Speech Soft  Interaction Forwards little  Motor Activity Slow  Appearance/Hygiene In scrubs  Behavior Characteristics Cooperative  Mood Preoccupied  Thought Process  Coherency WDL  Content WDL  Delusions None reported or observed  Perception WDL  Hallucination None reported or observed  Judgment Impaired  Confusion None  Danger to Self  Current suicidal ideation? Denies  Danger to Others  Danger to Others None reported or observed

## 2022-10-12 NOTE — Progress Notes (Signed)
D- Patient alert and oriented. Affect/mood. Sad and depressed.Denies SI, HI, AVH, and pain. " I got my plan all worked out and I can go home and have my dad help me manage my medication. A- Scheduled medications administered to patient, per MD orders. Support and encouragement provided.  Routine safety checks conducted every 15 minutes.  Patient informed to notify staff with problems or concerns. R- No adverse drug reactions noted. Patient contracts for safety at this time. Patient compliant with medications and treatment plan. Patient receptive, calm, and cooperative. Patient interacts well with others on the unit.  Patient remains safe at this time.

## 2022-10-12 NOTE — Progress Notes (Signed)
[facility] MD Progress Note  10/12/2022 12:33 PM [name]  MRN:  [id number]  Subjective:  Notes, labs, and vital signs reviewed; treatment team update completed. [name] met with the treatment team today and his goal was to get his medications restarted and "when I get out, rely on people like my mom and dad".  He does live with his father and gets along with him.  [name] has been socializing in the day room appropriately with staff and peers regularly.  Denies depression, "I'm not feeling depressed."  No anxiety or suicidal ideations.  Sleep is "good" and appetite is "normal" with no issues.  Denies side effects from his medications and desires discharge as soon as possible.  Principal Problem: MDD (major depressive disorder), recurrent severe, without psychosis (HCC) Diagnosis: Principal Problem:   MDD (major depressive disorder), recurrent severe, without psychosis (HCC)  Total Time spent with patient: 30 minutes  Past Psychiatric History: depression, anxiety,  Past Medical History:  Past Medical History:  Diagnosis Date   GAD (generalized anxiety disorder)    Hypertension    Hypospadias    Insomnia    Kidney stone    x4; 2 at age 63, lithotrypsy 01-2013   Liver function test abnormality    h/o   Major depression     was admited to [facility] x 1 week, [name]  is his psychiatrist-- Dx made 01-2007, started on meds then   MDD (major depressive disorder)    Moderate depressed bipolar I disorder (HCC)    Obesity    Suicidal ideations 2017   cutting behavior    Past Surgical History:  Procedure Laterality Date   LITHOTRIPSY     LIVER BIOPSY     WISDOM TOOTH EXTRACTION     Family History:  Family History  Problem Relation Age of Onset   Breast cancer Paternal Grandmother    Diabetes Paternal Grandmother    Heart disease Paternal Grandmother    Prostate cancer Paternal Grandfather    Heart disease Paternal Grandfather    Diabetes Maternal Grandmother    Heart disease  Father    Heart disease Maternal Grandfather    Colon cancer Neg Hx    Family Psychiatric  History: none Social History:  Social History   Substance and Sexual Activity  Alcohol Use Not Currently   Comment: 2 X a year     Social History   Substance and Sexual Activity  Drug Use No    Social History   Socioeconomic History   Marital status: Single    Spouse name: Not on file   Number of children: 0   Years of education: Not on file   Highest education level: Not on file  Occupational History   Occupation: finished HS   Occupation: not working  Tobacco Use   Smoking status: Never   Smokeless tobacco: Never  Substance and Sexual Activity   Alcohol use: Not Currently    Comment: 2 X a year   Drug use: No   Sexual activity: Not Currently    Partners: Female  Other Topics Concern   Not on file  Social History Narrative   Lost GF 04-2015   Currently lives w/ his father, takes care of father who had a stroke   Social Determinants of Health   Financial Resource Strain: Not on file  Food Insecurity: No Food Insecurity (10/10/2022)   Hunger Vital Sign    Worried About Running Out of Food in the Last Year: Never true  Ran Out of Food in the Last Year: Never true  Transportation Needs: No Transportation Needs (10/10/2022)   PRAPARE - Administrator, Civil Service (Medical): No    Lack of Transportation (Non-Medical): No  Physical Activity: Not on file  Stress: Not on file  Social Connections: Not on file   Additional Social History: lives with is father and works as a Chartered certified accountant    Sleep: Good  Appetite:  Good  Current Medications: Current Facility-Administered Medications  Medication Dose Route Frequency Provider Last Rate Last Admin   acetaminophen (TYLENOL) tablet 650 mg  650 mg Oral Q6H PRN Onuoha, Chinwendu V, NP       alum & mag hydroxide-simeth (MAALOX/MYLANTA) 200-200-20 MG/5ML suspension 30 mL  30 mL Oral Q4H PRN Onuoha, Chinwendu V, NP        diphenhydrAMINE (BENADRYL) capsule 50 mg  50 mg Oral TID PRN Onuoha, Chinwendu V, NP       Or   diphenhydrAMINE (BENADRYL) injection 50 mg  50 mg Intramuscular TID PRN Onuoha, Chinwendu V, NP       gabapentin (NEURONTIN) capsule 200 mg  200 mg Oral BID Charm Rings, NP   200 mg at 10/12/22 8938   haloperidol (HALDOL) tablet 5 mg  5 mg Oral TID PRN Onuoha, Chinwendu V, NP       Or   haloperidol lactate (HALDOL) injection 5 mg  5 mg Intramuscular TID PRN Onuoha, Chinwendu V, NP       hydrOXYzine (ATARAX) tablet 25 mg  25 mg Oral TID PRN Onuoha, Chinwendu V, NP       LORazepam (ATIVAN) tablet 2 mg  2 mg Oral TID PRN Onuoha, Chinwendu V, NP       Or   LORazepam (ATIVAN) injection 2 mg  2 mg Intramuscular TID PRN Onuoha, Chinwendu V, NP       magnesium hydroxide (MILK OF MAGNESIA) suspension 30 mL  30 mL Oral Daily PRN Onuoha, Chinwendu V, NP       sertraline (ZOLOFT) tablet 50 mg  50 mg Oral Daily Charm Rings, NP   50 mg at 10/12/22 0852   traZODone (DESYREL) tablet 50 mg  50 mg Oral QHS PRN Onuoha, Chinwendu V, NP   50 mg at 10/11/22 2114    Lab Results: No results found for this or any previous visit (from the past 48 hour(s)).  Blood Alcohol level:  Lab Results  Component Value Date   ETH <10 10/09/2022   ETH <5 09/07/2016    Metabolic Disorder Labs: Lab Results  Component Value Date   HGBA1C 5.3 12/22/2019   MPG 105 12/22/2019   MPG 119 02/10/2007   Lab Results  Component Value Date   PROLACTIN 9.4 04/02/2008   Lab Results  Component Value Date   CHOL 161 02/18/2021   TRIG 57.0 02/18/2021   HDL 51.00 02/18/2021   CHOLHDL 3 02/18/2021   VLDL 11.4 02/18/2021   LDLCALC 99 02/18/2021   LDLCALC 178 (H) 10/22/2020     Musculoskeletal: Strength & Muscle Tone: within normal limits Gait & Station: normal Patient leans: N/A  Psychiatric Specialty Exam: Physical Exam Vitals and nursing note reviewed.  Constitutional:      Appearance: Normal appearance.  HENT:      Head: Normocephalic.  Pulmonary:     Effort: Pulmonary effort is normal.  Musculoskeletal:        General: Normal range of motion.     Cervical back: Normal range of  motion.  Neurological:     General: No focal deficit present.     Mental Status: He is alert and oriented to person, place, and time.     Review of Systems  All other systems reviewed and are negative.   Blood pressure (!) 134/93, pulse 74, temperature 98 F (36.7 C), temperature source Oral, resp. rate 16, height 5\' 11"  (1.803 m), weight 109.8 kg, SpO2 99%.Body mass index is 33.75 kg/m.  General Appearance: Casual  Eye Contact:  Fair  Speech:  Normal Rate  Volume:  Normal  Mood:  "I don't feel depressed"  Affect:  Congruent  Thought Process:  Coherent  Orientation:  Full (Time, Place, and Person)  Thought Content:  WDL and Logical  Suicidal Thoughts:  No  Homicidal Thoughts:  No  Memory:  Immediate;   Good Recent;   Good Remote;   Good  Judgement:  Fair  Insight:  Fair  Psychomotor Activity:  Normal  Concentration:  Concentration: Good and Attention Span: Good  Recall:  Good  Fund of Knowledge:  Good  Language:  Good  Akathisia:  No  Handed:  Right  AIMS (if indicated):     Assets:  Housing Leisure Time Physical Health Resilience Social Support Vocational/Educational  ADL's:  Intact  Cognition:  WNL  Sleep:         Physical Exam: Physical Exam Vitals and nursing note reviewed.  Constitutional:      Appearance: Normal appearance.  HENT:     Head: Normocephalic.  Pulmonary:     Effort: Pulmonary effort is normal.  Musculoskeletal:        General: Normal range of motion.     Cervical back: Normal range of motion.  Neurological:     General: No focal deficit present.     Mental Status: He is alert and oriented to person, place, and time.    Review of Systems  All other systems reviewed and are negative.  Blood pressure (!) 134/93, pulse 74, temperature 98 F (36.7 C), temperature  source Oral, resp. rate 16, height 5\' 11"  (1.803 m), weight 109.8 kg, SpO2 99%. Body mass index is 33.75 kg/m.  Treatment Plan Summary: Daily contact with patient to assess and evaluate symptoms and progress in treatment, Medication management, and Plan : Major depressive disorder, recurrent, severe without psychosis: Zoloft 50 mg daily   General anxiety disorder: Gabapentin 200 mg BID   Insomnia: Trazodone 50 mg daily at bedtime PRN  Nanine Means, NP 10/12/2022, 12:33 PM

## 2022-10-12 NOTE — Group Note (Signed)
Date:  10/12/2022 Time:  9:15 AM  Group Topic/Focus:  Goals Group:   The focus of this group is to help patients establish daily goals to achieve during treatment and discuss how the patient can incorporate goal setting into their daily lives to aide in recovery.   Participation Level:  Did Not Attend   Lianni Kanaan A Ido Wollman 10/12/2022, 7:18 PM

## 2022-10-13 MED ORDER — HYDROXYZINE HCL 25 MG PO TABS: 25.0000 mg | ORAL_TABLET | Freq: Three times a day (TID) | ORAL | 0 refills | Status: AC | PRN

## 2022-10-13 MED ORDER — TRAZODONE HCL 50 MG PO TABS: 50.0000 mg | ORAL_TABLET | Freq: Every evening | ORAL | 0 refills | Status: DC | PRN

## 2022-10-13 MED ORDER — SERTRALINE HCL 50 MG PO TABS: 50.0000 mg | ORAL_TABLET | Freq: Every day | ORAL | 0 refills | Status: DC

## 2022-10-13 NOTE — Group Note (Signed)
Recreation Therapy Group Note   Group Topic:Goal Setting  Group Date: 10/13/2022 Start Time: 1000 End Time: 1100 Facilitators: Rosina Lowenstein, LRT, CTRS Location:  Craft Room  Group Description: Scientist, research (physical sciences). Patients were given many different magazines, a glue stick, markers, and a piece of cardstock paper. LRT and pts discussed the importance of having goals in life. LRT and pts discussed the difference between short-term and long-term goals, as well as what a SMART goal is. LRT encouraged pts to create a vision board, with images they picked and then cut out with safety scissors from the magazine, for themselves, that capture their short and long-term goals. LRT encouraged pts to show and explain their vision board to the group.   Goal Area(s) Addressed:  Patient will gain knowledge of short vs. long term goals.  Patient will identify goals for themselves. Patient will practice setting SMART goals. Patient will verbalize their goals to LRT and peers.   Affect/Mood: Appropriate   Participation Level: Active and Engaged   Participation Quality: Independent   Behavior: Calm and Cooperative   Speech/Thought Process: Coherent   Insight: Good   Judgement: Good   Modes of Intervention: Activity   Patient Response to Interventions:  Attentive and Receptive   Education Outcome:  Acknowledges education   Clinical Observations/Individualized Feedback: Ifeanyi was active in their participation of session activities and group discussion. Pt identified "I want to buy a house, I want to cook more since I like doing that and I want to learn a new language" as his goals. Pt interacted well with LRT and peers duration of session.   Plan: Continue to engage patient in RT group sessions 2-3x/week.   Rosina Lowenstein, LRT, CTRS 10/13/2022 11:41 AM

## 2022-10-13 NOTE — Group Note (Signed)
Date:  10/13/2022 Time:  10:20 AM  Group Topic/Focus:  Healthy Communication:   The focus of this group is to discuss communication, barriers to communication, as well as healthy ways to communicate with others.    Participation Level:  Active  Participation Quality:  Appropriate  Affect:  Appropriate  Cognitive:  Alert, Appropriate, and Oriented  Insight: Appropriate  Engagement in Group:  Developing/Improving and Engaged  Modes of Intervention:  Activity, Discussion, and Socialization  Additional Comments:    [name] 10/13/2022, 10:20 AM

## 2022-10-13 NOTE — Discharge Summary (Signed)
Physician Discharge Summary Note  Patient:  [name] is an 27 y.o., male MRN:  [id number] DOB:  1995-12-23 Patient phone:  [phone] (home)  Patient address:   [address] [place] [address],  Total Time spent with patient: 2.5 hours  Date of Admission:  10/10/2022 Date of Discharge: 10/13/2022  Reason for Admission:   The patient presented to the emergency department (ED) reporting a worsening of depressive symptoms and suicidal ideation after discontinuing his medications. He states: "I felt I didn't have control over myself and thought I was going to hurt myself." The patient reports that his symptoms had been building for the past two weeks, coinciding with stopping his medication regimen. The patient stopped taking Zoloft and Gabapentin approximately one month ago, stating " crazy work schedule"He reports being on both medications for two years and acknowledges that the regimen was helpful when followed.The patient now recognizes the importance of medication adherence, stating, "Even if I feel better, I will take my medications. Denies suicidal/homicidal ideation. Does not report delusions/hallucinations at this time. Client wants to continue with medication regimen. Chart and nursing notes reviewed  Principal Problem: MDD (major depressive disorder), recurrent severe, without psychosis (HCC) Discharge Diagnoses: Principal Problem:   MDD (major depressive disorder), recurrent severe, without psychosis (HCC)   Past Psychiatric History: Depression, ADHD, anxiety, panic attacks   Past Medical History:  Past Medical History:  Diagnosis Date   GAD (generalized anxiety disorder)    Hypertension    Hypospadias    Insomnia    Kidney stone    x4; 2 at age 77, lithotrypsy 01-2013   Liver function test abnormality    h/o   Major depression     was admited to [facility] x 1 week, [name]  is his psychiatrist-- Dx made 01-2007, started on meds then   MDD (major depressive  disorder)    Moderate depressed bipolar I disorder (HCC)    Obesity    Suicidal ideations 2017   cutting behavior    Past Surgical History:  Procedure Laterality Date   LITHOTRIPSY     LIVER BIOPSY     WISDOM TOOTH EXTRACTION     Family History:  Family History  Problem Relation Age of Onset   Breast cancer Paternal Grandmother    Diabetes Paternal Grandmother    Heart disease Paternal Grandmother    Prostate cancer Paternal Grandfather    Heart disease Paternal Grandfather    Diabetes Maternal Grandmother    Heart disease Father    Heart disease Maternal Grandfather    Colon cancer Neg Hx    Family Psychiatric  History: see above Social History:  Social History   Substance and Sexual Activity  Alcohol Use Not Currently   Comment: 2 X a year     Social History   Substance and Sexual Activity  Drug Use No    Social History   Socioeconomic History   Marital status: Single    Spouse name: Not on file   Number of children: 0   Years of education: Not on file   Highest education level: Not on file  Occupational History   Occupation: finished HS   Occupation: not working  Tobacco Use   Smoking status: Never   Smokeless tobacco: Never  Substance and Sexual Activity   Alcohol use: Not Currently    Comment: 2 X a year   Drug use: No   Sexual activity: Not Currently    Partners: Female  Other Topics  Concern   Not on file  Social History Narrative   Lost GF 04-2015   Currently lives w/ his father, takes care of father who had a stroke   Social Determinants of Health   Financial Resource Strain: Not on file  Food Insecurity: No Food Insecurity (10/10/2022)   Hunger Vital Sign    Worried About Running Out of Food in the Last Year: Never true    Ran Out of Food in the Last Year: Never true  Transportation Needs: No Transportation Needs (10/10/2022)   PRAPARE - Administrator, Civil Service (Medical): No    Lack of Transportation (Non-Medical): No   Physical Activity: Not on file  Stress: Not on file  Social Connections: Not on file    Hospital Course:  Upon admission, the patient was placed on suicide precautions given the history of suicidal ideation, though he denied any current plan or intent. He was cooperative with staff and engaged during the initial psychiatric evaluation. The patient reported a history of being on Zoloft and Gabapentin for two years, with both medications effectively managing his depression and anxiety symptoms. He acknowledged that stopping his medications led to a significant worsening of his mood and panic symptoms. Medication Review: The treatment team noted the patient's previous effective medication regimen and decided to restart Zoloft and Gabapentin, while also providing Trazodone for sleep. Adderall, which the patient had been using, was discontinued due to concerns that it exacerbated his anxiety and panic attacks. Day 2-3: Throughout his stay, the patient actively participated in group therapy and individual counseling sessions. He demonstrated insight into the negative consequences of discontinuing his medications and expressed a commitment to adhering to his medication regimen upon discharge, even when he feels better. The patient reported that his mood had improved and denied any ongoing suicidal ideation, homicidal ideation, or hallucinations. He expressed a desire to work on developing better coping skills for managing stress and panic attacks. Behavioral Observations: The patient remained calm and cooperative throughout his hospitalization. He displayed good judgment and insight into his condition, acknowledging that his panic attacks and reckless behavior were likely exacerbated by medication non-adherence. Safety Plan: The patient worked with the treatment team to develop a comprehensive safety plan, which included strategies for managing future episodes of depression and panic. He verbalized an  understanding of when and how to seek help if suicidal ideation or severe symptoms return    Musculoskeletal: Strength & Muscle Tone: within normal limits Gait & Station: normal Patient leans: N/A   Psychiatric Specialty Exam:  Presentation  General Appearance:  Appropriate for Environment; Casual  Eye Contact: Good  Speech: Clear and Coherent  Speech Volume: Normal  Handedness: Right   Mood and Affect  Mood: Anxious (about discharge)  Affect: Appropriate; Congruent   Thought Process  Thought Processes: Coherent  Descriptions of Associations:Intact  Orientation:Full (Time, Place and Person) (and situation)  Thought Content:WDL; Logical  History of Schizophrenia/Schizoaffective disorder:none noted Duration of Psychotic Symptoms:No data recorded Hallucinations:Hallucinations: None  Ideas of Reference:None  Suicidal Thoughts:Suicidal Thoughts: No SI Active Intent and/or Plan: -- (none noted)  Homicidal Thoughts:Homicidal Thoughts: No   Sensorium  Memory: Immediate Good  Judgment: Good  Insight: Good   Executive Functions  Concentration: Fair  Attention Span: Good  Recall: Good  Fund of Knowledge: Good  Language: Good   Psychomotor Activity  Psychomotor Activity: Psychomotor Activity: Normal   Assets  Assets: Communication Skills; Desire for Improvement; Financial Resources/Insurance; Social Support; Resilience; Physical Health;  Housing; Talents/Skills; Transportation   Sleep  Sleep: Sleep: Good Number of Hours of Sleep: 8    Physical Exam: Physical Exam Vitals and nursing note reviewed.  Constitutional:      Appearance: Normal appearance.  HENT:     Head: Normocephalic and atraumatic.     Nose: Nose normal.  Pulmonary:     Effort: Pulmonary effort is normal.  Musculoskeletal:        General: Normal range of motion.     Cervical back: Normal range of motion.  Neurological:     General: No focal deficit  present.     Mental Status: He is alert and oriented to person, place, and time.  Psychiatric:        Mood and Affect: Mood normal.        Behavior: Behavior normal.        Thought Content: Thought content normal.        Judgment: Judgment normal.    Review of Systems  All other systems reviewed and are negative.  Blood pressure 135/80, pulse 75, temperature 98.4 F (36.9 C), temperature source Oral, resp. rate 17, height 5\' 11"  (1.803 m), weight 109.8 kg, SpO2 100%. Body mass index is 33.75 kg/m.   Social History   Tobacco Use  Smoking Status Never  Smokeless Tobacco Never   Tobacco Cessation:  N/A, patient does not currently use tobacco products   Blood Alcohol level:  Lab Results  Component Value Date   ETH <10 10/09/2022   ETH <5 09/07/2016    Metabolic Disorder Labs:  Lab Results  Component Value Date   HGBA1C 5.3 12/22/2019   MPG 105 12/22/2019   MPG 119 02/10/2007   Lab Results  Component Value Date   PROLACTIN 9.4 04/02/2008   Lab Results  Component Value Date   CHOL 161 02/18/2021   TRIG 57.0 02/18/2021   HDL 51.00 02/18/2021   CHOLHDL 3 02/18/2021   VLDL 11.4 02/18/2021   LDLCALC 99 02/18/2021   LDLCALC 178 (H) 10/22/2020    See Psychiatric Specialty Exam and Suicide Risk Assessment completed by Attending Physician prior to discharge.  Discharge destination:  Home  Is patient on multiple antipsychotic therapies at discharge:  No   Has Patient had three or more failed trials of antipsychotic monotherapy by history:  No  Recommended Plan for Multiple Antipsychotic Therapies: NA   Allergies as of 10/13/2022   No Known Allergies      Medication List     STOP taking these medications    amphetamine-dextroamphetamine 30 MG tablet Commonly known as: Adderall   atorvastatin 40 MG tablet Commonly known as: LIPITOR   propranolol 20 MG tablet Commonly known as: INDERAL   traZODone 100 MG tablet Commonly known as: DESYREL Replaced  by: traZODone 50 MG tablet   vitamin E 180 MG (400 UNITS) capsule Commonly known as: vitamin E       TAKE these medications      Indication  gabapentin 100 MG capsule Commonly known as: NEURONTIN Take 2 capsules (200 mg total) by mouth 2 (two) times daily.  Indication: Generalized Anxiety Disorder   hydrOXYzine 25 MG tablet Commonly known as: ATARAX Take 1 tablet (25 mg total) by mouth every 8 (eight) hours as needed for up to 15 days for anxiety.  Indication: Feeling Anxious   sertraline 50 MG tablet Commonly known as: ZOLOFT Take 1 tablet (50 mg total) by mouth daily. Start taking on: October 14, 2022 What changed:  medication strength  how much to take how to take this when to take this additional instructions  Indication: Major Depressive Disorder   traZODone 50 MG tablet Commonly known as: DESYREL Take 1 tablet (50 mg total) by mouth at bedtime as needed for sleep. Replaces: traZODone 100 MG tablet  Indication: Trouble Sleeping        Follow-up Information     [facility] Follow up on 10/27/2022.   Why: Your appointment is scheduled for October 15th @ 11:15 AM. Please remember to bring your insurance card. Contact information: [address] [place] [address]                Follow-up recommendations:  Activity:  as tolerated Diet:  heart healthy  Comments:  see discharge summary  Signed: Myriam Forehand, NP 10/13/2022, 2:47 PM

## 2022-10-13 NOTE — Plan of Care (Signed)
  Problem: Education: Goal: Knowledge of General Education information will improve Description: Including pain rating scale, medication(s)/side effects and non-pharmacologic comfort measures Outcome: Adequate for Discharge   Problem: Health Behavior/Discharge Planning: Goal: Ability to manage health-related needs will improve Outcome: Adequate for Discharge   Problem: Clinical Measurements: Goal: Ability to maintain clinical measurements within normal limits will improve Outcome: Adequate for Discharge Goal: Will remain free from infection Outcome: Adequate for Discharge Goal: Diagnostic test results will improve Outcome: Adequate for Discharge Goal: Respiratory complications will improve Outcome: Adequate for Discharge Goal: Cardiovascular complication will be avoided Outcome: Adequate for Discharge   Problem: Activity: Goal: Risk for activity intolerance will decrease Outcome: Adequate for Discharge   Problem: Nutrition: Goal: Adequate nutrition will be maintained Outcome: Adequate for Discharge   Problem: Coping: Goal: Level of anxiety will decrease Outcome: Adequate for Discharge   Problem: Elimination: Goal: Will not experience complications related to bowel motility Outcome: Adequate for Discharge Goal: Will not experience complications related to urinary retention Outcome: Adequate for Discharge   Problem: Pain Managment: Goal: General experience of comfort will improve Outcome: Adequate for Discharge   Problem: Safety: Goal: Ability to remain free from injury will improve Outcome: Adequate for Discharge   Problem: Skin Integrity: Goal: Risk for impaired skin integrity will decrease Outcome: Adequate for Discharge   Problem: Education: Goal: Knowledge of [place] [facility] Education information/materials will improve Outcome: Adequate for Discharge Goal: Emotional status will improve Outcome: Adequate for Discharge Goal: Mental status will  improve Outcome: Adequate for Discharge Goal: Verbalization of understanding the information provided will improve Outcome: Adequate for Discharge   Problem: Activity: Goal: Interest or engagement in activities will improve Outcome: Adequate for Discharge Goal: Sleeping patterns will improve Outcome: Adequate for Discharge   Problem: Coping: Goal: Ability to verbalize frustrations and anger appropriately will improve Outcome: Adequate for Discharge Goal: Ability to demonstrate self-control will improve Outcome: Adequate for Discharge   Problem: Health Behavior/Discharge Planning: Goal: Identification of resources available to assist in meeting health care needs will improve Outcome: Adequate for Discharge Goal: Compliance with treatment plan for underlying cause of condition will improve Outcome: Adequate for Discharge   Problem: Physical Regulation: Goal: Ability to maintain clinical measurements within normal limits will improve Outcome: Adequate for Discharge   Problem: Safety: Goal: Periods of time without injury will increase Outcome: Adequate for Discharge   Problem: Education: Goal: Ability to make informed decisions regarding treatment will improve Outcome: Adequate for Discharge   Problem: Coping: Goal: Coping ability will improve Outcome: Adequate for Discharge   Problem: Health Behavior/Discharge Planning: Goal: Identification of resources available to assist in meeting health care needs will improve Outcome: Adequate for Discharge   Problem: Medication: Goal: Compliance with prescribed medication regimen will improve Outcome: Adequate for Discharge   Problem: Self-Concept: Goal: Ability to disclose and discuss suicidal ideas will improve Outcome: Adequate for Discharge Goal: Will verbalize positive feelings about self Outcome: Adequate for Discharge Note:     Problem: Education: Goal: Utilization of techniques to improve thought processes will  improve Outcome: Adequate for Discharge

## 2022-10-13 NOTE — BHH Suicide Risk Assessment (Signed)
[facility] Discharge Suicide Risk Assessment   Principal Problem: MDD (major depressive disorder), recurrent severe, without psychosis (HCC) Discharge Diagnoses: Principal Problem:   MDD (major depressive disorder), recurrent severe, without psychosis (HCC)   Total Time spent with patient: 3 hours  Musculoskeletal: Strength & Muscle Tone: within normal limits Gait & Station: normal Patient leans: N/A  Psychiatric Specialty Exam  Presentation  General Appearance:  Appropriate for Environment; Casual  Eye Contact: Good  Speech: Clear and Coherent  Speech Volume: Normal  Handedness:Right   Mood and Affect  Mood: Anxious (about discharge)  Duration of Depression Symptoms: No data recorded Affect: Appropriate; Congruent   Thought Process  Thought Processes: Coherent  Descriptions of Associations:Intact  Orientation:Full (Time, Place and Person) (and situation)  Thought Content:WDL; Logical  History of Schizophrenia/Schizoaffective disorder:none noted Duration of Psychotic Symptoms:No data recorded Hallucinations:Hallucinations: None  Ideas of Reference:None  Suicidal Thoughts:Suicidal Thoughts: No SI Active Intent and/or Plan: -- (none noted)  Homicidal Thoughts:Homicidal Thoughts: No   Sensorium  Memory: Immediate Good  Judgment: Good  Insight: Good   Executive Functions  Concentration: Fair  Attention Span: Good  Recall: Good  Fund of Knowledge: Good  Language: Good   Psychomotor Activity  Psychomotor Activity:Psychomotor Activity: Normal   Assets  Assets: Communication Skills; Desire for Improvement; Financial Resources/Insurance; Social Support; Resilience; Physical Health; Housing; Talents/Skills; Transportation   Sleep  Sleep:Sleep: Good Number of Hours of Sleep: 8   Physical Exam: Physical Exam ROS Blood pressure 135/80, pulse 75, temperature 98.4 F (36.9 C), temperature source Oral, resp. rate 17, height 5\' 11"   (1.803 m), weight 109.8 kg, SpO2 100%. Body mass index is 33.75 kg/m.  Mental Status Per Nursing Assessment::   On Admission:  NA  Demographic Factors:  Adolescent or young adult  Loss Factors: NA  Historical Factors: Impulsivity  Risk Reduction Factors:   Sense of responsibility to family, Employed, Positive social support, and Positive therapeutic relationship  Continued Clinical Symptoms:  Depression:   Impulsivity Insomnia Previous Psychiatric Diagnoses and Treatments  Cognitive Features That Contribute To Risk:  None    Suicide Risk:  Minimal: No identifiable suicidal ideation.  Patients presenting with no risk factors but with morbid ruminations; may be classified as minimal risk based on the severity of the depressive symptoms   Follow-up Information     [facility] Follow up on 10/27/2022.   Why: Your appointment is scheduled for October 15th @ 11:15 AM. Please remember to bring your insurance card. Contact information: [address] [place] [address]                Plan Of Care/Follow-up recommendations:  Activity:  as tolerated Diet:  heart healthy  Myriam Forehand, NP 10/13/2022, 2:38 PM

## 2022-10-13 NOTE — Progress Notes (Signed)
  [facility] Adult Case Management Discharge Plan :  Will you be returning to the same living situation after discharge:  Yes,  pt reports that he is returning home.  At discharge, do you have transportation home?: Yes,  pt reports that family will provide transportation. Do you have the ability to pay for your medications: Yes,  [employer] [facility] / [place] [facility]  Release of information consent forms completed and in the chart;  Patient's signature needed at discharge.  Patient to Follow up at:  Follow-up Information     [facility] Follow up on 10/27/2022.   Why: Your appointment is scheduled for October 15th @ 11:15 AM. Please remember to bring your insurance card. Contact information: [address] [place] [address]                Next level of care provider has access to [facility] Link:no  Safety Planning and Suicide Prevention discussed: Yes,  SPE completed with the patient.       Has patient been referred to the Quitline?: Patient refused referral for treatment  Patient has been referred for addiction treatment: No known substance use disorder.  Harden Mo, LCSW 10/13/2022, 11:30 AM

## 2022-10-13 NOTE — Progress Notes (Signed)
Discharge Note:  Patient denies SI/HI/AVH at this time. Discharge instructions, AVS and transition record gone over with patient. Patient agrees to comply with medication management, follow-up visit, and outpatient therapy. Patient belongings returned to patient. Patient questions and concerns addressed and answered. Patient ambulatory off unit. Patient discharged to home with his mother.

## 2022-10-16 ENCOUNTER — Telehealth: Payer: Self-pay | Admitting: *Deleted

## 2022-10-16 NOTE — Transitions of Care (Post Inpatient/ED Visit) (Signed)
   10/16/2022  Name: JADRIEL SAXER MRN: [id number] DOB: 1995-06-30  Today's TOC FU Call Status: Today's TOC FU Call Status:: Unsuccessful Call (1st Attempt) Unsuccessful Call (1st Attempt) Date: 10/16/22  Attempted to reach the patient regarding the most recent Inpatient visit;  Received automated outgoing voice message stating that "the person you are trying to call has a voice mail box that is full; please hang up and try your call again later;" unable to leave voice message requesting call back   Follow Up Plan: Additional outreach attempts will be made to reach the patient to complete the Transitions of Care (Post Inpatient visit) call.   Caryl Pina, RN, BSN, Media planner  Transitions of Care  [facility] [phone]: direct office

## 2022-10-19 ENCOUNTER — Telehealth: Payer: Self-pay

## 2022-10-19 NOTE — Transitions of Care (Post Inpatient/ED Visit) (Signed)
   10/19/2022  Name: NAOL ONTIVEROS MRN: [id number] DOB: [birth year]  Today's TOC FU Call Status: Today's TOC FU Call Status:: Unsuccessful Call (2nd Attempt) Unsuccessful Call (2nd Attempt) Date: 10/19/22  Attempted to reach the patient regarding the most recent Inpatient/ED visit.  Follow Up Plan: Additional outreach attempts will be made to reach the patient to complete the Transitions of Care (Post Inpatient/ED visit) call.   Alyse Low, RN, BA, [facility], CRRN [facility] Coordinator, Transition of Care Ph # [phone]

## 2022-10-20 ENCOUNTER — Telehealth: Payer: Self-pay

## 2022-10-20 NOTE — Transitions of Care (Post Inpatient/ED Visit) (Signed)
   10/20/2022  Name: [name] MRN: [id number] DOB: 02-14-1995  Today's TOC FU Call Status: Today's TOC FU Call Status:: Unsuccessful Call (3rd Attempt) Unsuccessful Call (3rd Attempt) Date: 10/20/22  Attempted to reach the patient regarding the most recent Inpatient/ED visit.  Follow Up Plan: No further outreach attempts will be made at this time. We have been unable to contact the patient.  Alyse Low, RN, BA, [facility], CRRN [facility] Coordinator, Transition of Care Ph # [phone]

## 2022-10-28 ENCOUNTER — Encounter: Payer: Self-pay | Admitting: Physician Assistant

## 2022-10-28 ENCOUNTER — Ambulatory Visit: Payer: 59 | Admitting: Physician Assistant

## 2022-10-28 DIAGNOSIS — F900 Attention-deficit hyperactivity disorder, predominantly inattentive type: Secondary | ICD-10-CM

## 2022-10-28 DIAGNOSIS — F411 Generalized anxiety disorder: Secondary | ICD-10-CM

## 2022-10-28 DIAGNOSIS — Z8659 Personal history of other mental and behavioral disorders: Secondary | ICD-10-CM

## 2022-10-28 DIAGNOSIS — F3341 Major depressive disorder, recurrent, in partial remission: Secondary | ICD-10-CM | POA: Diagnosis not present

## 2022-10-28 DIAGNOSIS — Z9289 Personal history of other medical treatment: Secondary | ICD-10-CM | POA: Diagnosis not present

## 2022-10-28 MED ORDER — AMPHETAMINE-DEXTROAMPHETAMINE 30 MG PO TABS: 15.0000 mg | ORAL_TABLET | Freq: Two times a day (BID) | ORAL | 0 refills | Status: DC

## 2022-10-28 MED ORDER — SERTRALINE HCL 100 MG PO TABS: 100.0000 mg | ORAL_TABLET | Freq: Every day | ORAL | 0 refills | Status: DC

## 2022-10-28 NOTE — Progress Notes (Signed)
[facility]  Patient ID: [name],  MRN: [id number]  PCP: Wanda Plump, MD  Date of Evaluation: 10/28/2022 Time spent:30 minutes  Chief Complaint:  Chief Complaint   Anxiety; Depression; Follow-up    HISTORY/CURRENT STATUS: HPI For  hospital follow up.   [name] was hospitalized for worsening depression and suicidal thoughts on 10/10/2022.  He was discharged 10/13/2022.  He had not been taking his routine medication of Zoloft and gabapentin for about a month.  He was put back on it and states he can already feel a positive change.  "I have learned by lesson.  I need to stay on the medicine."  At the time he would forget to take it some days when he was working, his job entailed sometimes 3 to 6 hours of driving every day just to get to a job, he would forget to take the medicine or take it too late so he ended up stopping it.  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  He likes it, has a routine schedule and not having to travel all over the place has been helpful.  Because of his new job he has not been able to enjoy much of anything because he is tired but he plans to start back doing fun things like playing video games once he gets more used to his job.  Energy and motivation are good.  No extreme sadness, tearfulness, or feelings of hopelessness.  Sleeps well most of the time. ADLs and personal hygiene are normal.  He is purposely losing weight.  No complaints of anxiety.  Denies suicidal or homicidal thoughts.  He was not taking the Adderall when he was hospitalized.  Stated he did not need it.  It is still effective and he needs prescriptions. States that attention is good without easy distractibility.  Able to focus on things and finish tasks to completion.   Patient denies increased energy with decreased need for sleep, increased talkativeness, racing thoughts, impulsivity or risky behaviors,  increased spending, increased libido, grandiosity, increased irritability or anger, paranoia, or hallucinations.  Denies dizziness, syncope, seizures, numbness, tingling, tremor, tics, unsteady gait, slurred speech, confusion. Denies muscle or joint pain, stiffness, or dystonia.  Individual Medical History/ Review of Systems: Changes? :No    Past medications for mental health diagnoses include: Lithium, BuSpar, Xanax, Seroquel, Risperdal, Depakote, Zoloft, Strattera quit working after awhile, Clonidine caused fatigue and increased depression.   Hospitalized 10/10/2022 to 10/13/2022 for suicidal thoughts after having stopped Zoloft and gabapentin a month before  Allergies: Patient has no known allergies.  Current Medications:  Current Outpatient Medications:    gabapentin (NEURONTIN) 100 MG capsule, Take 2 capsules (200 mg total) by mouth 2 (two) times daily., Disp: 120 capsule, Rfl: 5   hydrOXYzine (ATARAX) 25 MG tablet, Take 1 tablet (25 mg total) by mouth every 8 (eight) hours as needed for up to 15 days for anxiety., Disp: 30 tablet, Rfl: 0   sertraline (ZOLOFT) 100 MG tablet, Take 1 tablet (100 mg total) by mouth daily., Disp: 90 tablet, Rfl: 0   traZODone (DESYREL) 50 MG tablet, Take 1 tablet (50 mg total) by mouth at bedtime as needed for sleep., Disp: 30 tablet, Rfl: 0   [START ON 11/27/2022] amphetamine-dextroamphetamine (ADDERALL) 30 MG tablet, Take 0.5 tablets by mouth 2 (two) times daily., Disp: 30 tablet, Rfl: 0   amphetamine-dextroamphetamine (ADDERALL) 30 MG tablet, Take 0.5  tablets by mouth 2 (two) times daily., Disp: 30 tablet, Rfl: 0 Medication Side Effects: none  Family Medical/ Social History: Changes? 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     General Appearance: Casual, Well Groomed and Obese  Eye Contact:  Good  Speech:  Clear and Coherent and  Normal Rate  Volume:  Normal  Mood:  Euthymic  Affect:  Congruent  Thought Process:  logical   Orientation:  Full (Time, Place, and Person)  Thought Content: Logical   Suicidal Thoughts:  No  Homicidal Thoughts:  No  Memory:  WNL  Judgement:  Good  Insight:  Good  Psychomotor Activity:  Normal  Concentration:  Concentration: Good and Attention Span: Good  Recall:  Good  Fund of Knowledge: Good  Language: Good  Assets:  Desire for Improvement Financial Resources/Insurance Housing Transportation Vocational/Educational  ADL's:  Intact  Cognition: WNL  Prognosis:  Good   DIAGNOSES:    ICD-10-CM   1. Attention deficit hyperactivity disorder (ADHD), predominantly inattentive type  F90.0     2. Recurrent major depressive disorder, in partial remission (HCC)  F33.41     3. Generalized anxiety disorder  F41.1     4. History of recent hospitalization  Z92.89     5. History of suicidal ideation  Z86.59       Receiving Psychotherapy: No     RECOMMENDATIONS:  PDMP was reviewed.  Adderall filled for 09/28/2022. .  Gabapentin filled 07/31/2022. I provided 30 minutes of face to face time during this encounter, including time spent before and after the visit in records review, medical decision making, counseling pertinent to today's visit, and charting.   I am glad he is feeling better.  We discussed medication compliance.  He states he has learned his lesson and will go off again.  Continue Adderall   30 mg, 1/2 p.o. every morning and 1/2 pill around lunchtime. Continue gabapentin 200 mg p.o. twice daily. Continue propranolol 20 mg, 1 p.o. bid and then 1 mid day prn.  Continue Zoloft 100 mg, 1 p.o. nightly. Continue trazodone 100 mg, 1/4-2 p.o. nightly as needed sleep. Return in 6 weeks.  [name], PA-C

## 2022-11-03 NOTE — Telephone Encounter (Signed)
PA approved 10/03/2023 with [facility]

## 2022-11-11 ENCOUNTER — Ambulatory Visit: Payer: 59 | Admitting: Physician Assistant

## 2022-12-09 ENCOUNTER — Ambulatory Visit (INDEPENDENT_AMBULATORY_CARE_PROVIDER_SITE_OTHER): Payer: Self-pay | Admitting: Physician Assistant

## 2022-12-09 DIAGNOSIS — Z91199 Patient's noncompliance with other medical treatment and regimen due to unspecified reason: Secondary | ICD-10-CM

## 2022-12-09 NOTE — Progress Notes (Signed)
No show

## 2023-05-06 ENCOUNTER — Other Ambulatory Visit: Payer: Self-pay | Admitting: Physician Assistant

## 2023-09-06 ENCOUNTER — Other Ambulatory Visit: Payer: Self-pay | Admitting: Physician Assistant

## 2023-09-08 ENCOUNTER — Other Ambulatory Visit: Payer: Self-pay | Admitting: Physician Assistant

## 2023-09-08 NOTE — Telephone Encounter (Signed)
 Pt called and made an appt for 10/15/23. Please send in his sertraline 

## 2023-09-09 ENCOUNTER — Other Ambulatory Visit: Payer: Self-pay | Admitting: Physician Assistant

## 2023-09-09 NOTE — Telephone Encounter (Signed)
 Notified that RF had been refused. He said he was going to go thru withdrawal if he didn't have his medications. I told him his last RF was sent in 10/24 for a 90-day supply. He said he got filled in [place], [place], but I don't see this in med history. He reports being on it continuously. He is unable to locate prescription bottle to provide information, will call back when he can find it.

## 2023-09-09 NOTE — Addendum Note (Signed)
 Addended by: CON BRISTLE T on: 09/09/2023 11:27 AM   Modules accepted: Orders

## 2023-09-14 ENCOUNTER — Other Ambulatory Visit: Payer: Self-pay | Admitting: Physician Assistant

## 2023-09-17 NOTE — Telephone Encounter (Signed)
 Please call to schedule an earlier appt with Rosey Bath.

## 2023-09-22 NOTE — Telephone Encounter (Signed)
 Called lvm to rtc for sooner apt per [facility]

## 2023-09-29 ENCOUNTER — Encounter: Payer: Self-pay | Admitting: Physician Assistant

## 2023-09-29 ENCOUNTER — Ambulatory Visit (INDEPENDENT_AMBULATORY_CARE_PROVIDER_SITE_OTHER): Payer: Self-pay | Admitting: Physician Assistant

## 2023-09-29 DIAGNOSIS — F900 Attention-deficit hyperactivity disorder, predominantly inattentive type: Secondary | ICD-10-CM | POA: Diagnosis not present

## 2023-09-29 DIAGNOSIS — F3341 Major depressive disorder, recurrent, in partial remission: Secondary | ICD-10-CM

## 2023-09-29 MED ORDER — AMPHETAMINE-DEXTROAMPHET ER 10 MG PO CP24: 10.0000 mg | ORAL_CAPSULE | Freq: Every day | ORAL | 0 refills | Status: DC

## 2023-09-29 MED ORDER — SERTRALINE HCL 100 MG PO TABS: 100.0000 mg | ORAL_TABLET | Freq: Every day | ORAL | 1 refills | Status: AC

## 2023-09-29 NOTE — Progress Notes (Signed)
 [facility]  Patient ID: [name],  MRN: [id number]  PCP: Amon Aloysius BRAVO, MD  Date of Evaluation: 09/29/2023 Time spent:20 minutes  Chief Complaint:  Chief Complaint   ADHD; Anxiety; Depression; Insomnia; Follow-up    HISTORY/CURRENT STATUS: HPI  Lost to f/u for 9 months when he moved away, now back in [name]  Has been off Zoloft  for about a week.  He didn't f/u at scheduled appt last fall so rx was denied.  It has really helped with anxiety and depression. No PA.  Patient is able to enjoy things.  Energy and motivation are good.  Work is going well.   No extreme sadness, tearfulness, or feelings of hopelessness.  Sleeps well, sometimes takes melatonin but not often.  ADLs and personal hygiene are normal.  Appetite has not changed.  Weight is stable.  No mania, delirium, AH/VH.  No SI/HI.  He'd like to get back on Adderall. He took the IR and it was hard for him to remember bid dosing.  Wonders if going on a long acting would be an option.   Individual Medical History/ Review of Systems: Changes? :Yes   currently has a cold.   Past medications for mental health diagnoses include: Lithium , BuSpar, Xanax , Seroquel , Risperdal, Depakote, Zoloft , Strattera  quit working after awhile, Clonidine  caused fatigue and increased depression, Adderall  Hospitalized 10/10/2022 to 10/13/2022 for suicidal thoughts after having stopped Zoloft  and gabapentin  a month before  Allergies: Patient has no known allergies.  Current Medications:  Current Outpatient Medications:    amphetamine -dextroamphetamine  (ADDERALL XR) 10 MG 24 hr capsule, Take 1 capsule (10 mg total) by mouth daily., Disp: 30 capsule, Rfl: 0   sertraline  (ZOLOFT ) 100 MG tablet, Take 1 tablet (100 mg total) by mouth daily., Disp: 90 tablet, Rfl: 1 Medication Side Effects: none  Family Medical/ Social History: Changes? Works at [employer] now, Merchandiser, retail in [employer].    MENTAL HEALTH EXAM:  There were no vitals taken  for this visit.There is no height or weight on file to calculate BMI.     General Appearance: Casual, Well Groomed and Obese  Eye Contact:  Good  Speech:  Clear and Coherent and Normal Rate  Volume:  Normal  Mood:  Euthymic  Affect:  Congruent  Thought Process:  logical   Orientation:  Full (Time, Place, and Person)  Thought Content: Logical   Suicidal Thoughts:  No  Homicidal Thoughts:  No  Memory:  WNL  Judgement:  Good  Insight:  Good  Psychomotor Activity:  Normal  Concentration:  Concentration: Fair and Attention Span: Fair  Recall:  Good  Fund of Knowledge: Good  Language: Good  Assets:  Desire for Improvement Financial Resources/Insurance Housing Transportation Vocational/Educational  ADL's:  Intact  Cognition: WNL  Prognosis:  Good   DIAGNOSES:    ICD-10-CM   1. Attention deficit hyperactivity disorder (ADHD), predominantly inattentive type  F90.0     2. Recurrent major depressive disorder, in partial remission (HCC)  F33.41       Receiving Psychotherapy: No     RECOMMENDATIONS:  PDMP was reviewed.  Adderall filled 01/17/2023. I provided approximately   20  minutes of face to face time during this encounter, including time spent before and after the visit in records review, medical decision making, counseling pertinent to today's visit, and charting.   We discussed the Adderall XR and I will prescribe at a low dose and recheck in 4 to 6 weeks.  Start Adderall XR 10 mg,  1 p.o. every morning. Continue Zoloft  100 mg, 1 p.o. nightly. Return in 4 to 6 weeks.  Verneita Cooks, PA-C

## 2023-10-04 ENCOUNTER — Telehealth: Payer: Self-pay | Admitting: Physician Assistant

## 2023-10-04 NOTE — Telephone Encounter (Signed)
 Next appt is 10/28/23. [name] states that [facility] on [address] have Adderall XR 10 mg in stock. He said [facility] [address].   [id number] [address], [place] - [address] AT [facility] [address]  Phone: [phone]  Fax: [phone]

## 2023-10-05 ENCOUNTER — Other Ambulatory Visit: Payer: Self-pay

## 2023-10-05 DIAGNOSIS — F900 Attention-deficit hyperactivity disorder, predominantly inattentive type: Secondary | ICD-10-CM

## 2023-10-05 MED ORDER — AMPHETAMINE-DEXTROAMPHET ER 10 MG PO CP24
10.0000 mg | ORAL_CAPSULE | Freq: Every day | ORAL | 0 refills | Status: DC
Start: 2023-10-05 — End: 2023-10-28

## 2023-10-05 NOTE — Telephone Encounter (Signed)
 Canceled at [facility] and pended to [facility]

## 2023-10-15 ENCOUNTER — Ambulatory Visit: Admitting: Physician Assistant

## 2023-10-28 ENCOUNTER — Encounter: Payer: Self-pay | Admitting: Physician Assistant

## 2023-10-28 ENCOUNTER — Ambulatory Visit (INDEPENDENT_AMBULATORY_CARE_PROVIDER_SITE_OTHER): Payer: Self-pay | Admitting: Physician Assistant

## 2023-10-28 DIAGNOSIS — F3341 Major depressive disorder, recurrent, in partial remission: Secondary | ICD-10-CM | POA: Diagnosis not present

## 2023-10-28 DIAGNOSIS — F411 Generalized anxiety disorder: Secondary | ICD-10-CM | POA: Diagnosis not present

## 2023-10-28 DIAGNOSIS — F900 Attention-deficit hyperactivity disorder, predominantly inattentive type: Secondary | ICD-10-CM | POA: Diagnosis not present

## 2023-10-28 MED ORDER — AMPHETAMINE-DEXTROAMPHET ER 20 MG PO CP24: 20.0000 mg | ORAL_CAPSULE | Freq: Every day | ORAL | 0 refills | Status: DC

## 2023-10-28 NOTE — Progress Notes (Signed)
 [facility]  Patient ID: [name],  MRN: [id number]  PCP: Amon Aloysius BRAVO, MD  Date of Evaluation: 10/28/2023 Time spent:20 minutes  Chief Complaint:  Chief Complaint   ADHD; Depression; Follow-up    HISTORY/CURRENT STATUS: HPI  For 1 month f/u.   He restarted Adderall last month.  It's not helping much at all.  Still has trouble focusing and gets distracted easily.  It's not affecting his job but he knows things could be better if the Adderall was more effective.   Patient is able to enjoy things.  Energy and motivation are good.  Work is going well.   No extreme sadness, tearfulness, or feelings of hopelessness.  Sleeps well most of the time. ADLs and personal hygiene are normal.   Appetite has not changed.  Weight is stable.   No mania, delirium, AH/VH.  No SI/HI.  Individual Medical History/ Review of Systems: Changes? :No      Past medications for mental health diagnoses include: Lithium , BuSpar, Xanax , Seroquel , Risperdal, Depakote, Zoloft , Strattera  quit working after awhile, Clonidine  caused fatigue and increased depression, Adderall  Hospitalized 10/10/2022 to 10/13/2022 for suicidal thoughts after having stopped Zoloft  and gabapentin  a month before  Allergies: Patient has no known allergies.  Current Medications:  Current Outpatient Medications:    amphetamine -dextroamphetamine  (ADDERALL XR) 20 MG 24 hr capsule, Take 1 capsule (20 mg total) by mouth daily., Disp: 30 capsule, Rfl: 0   sertraline  (ZOLOFT ) 100 MG tablet, Take 1 tablet (100 mg total) by mouth daily., Disp: 90 tablet, Rfl: 1 Medication Side Effects: none  Family Medical/ Social History: Changes? Works at [employer] now, Merchandiser, retail in [employer].    MENTAL HEALTH EXAM:  There were no vitals taken for this visit.There is no height or weight on file to calculate BMI.     General Appearance: Casual, Well Groomed and Obese  Eye Contact:  Good  Speech:  Clear and Coherent and Normal Rate  Volume:   Normal  Mood:  Euthymic  Affect:  Congruent  Thought Process:  logical   Orientation:  Full (Time, Place, and Person)  Thought Content: Logical   Suicidal Thoughts:  No  Homicidal Thoughts:  No  Memory:  WNL  Judgement:  Good  Insight:  Good  Psychomotor Activity:  Normal  Concentration:  Concentration: Fair and Attention Span: Fair  Recall:  Good  Fund of Knowledge: Good  Language: Good  Assets:  Desire for Improvement Financial Resources/Insurance Housing Resilience Transportation Vocational/Educational  ADL's:  Intact  Cognition: WNL  Prognosis:  Good   DIAGNOSES:    ICD-10-CM   1. Attention deficit hyperactivity disorder (ADHD), predominantly inattentive type  F90.0     2. Recurrent major depressive disorder, in partial remission  F33.41     3. Generalized anxiety disorder  F41.1       Receiving Psychotherapy: No     RECOMMENDATIONS:  PDMP was reviewed.  Adderall filled 10/05/2023. I provided approximately 20  minutes of face to face time during this encounter, including time spent before and after the visit in records review, medical decision making, counseling pertinent to today's visit, and charting.   Increase Adderall XR to 20 mg, 1 p.o. every morning. Continue Zoloft  100 mg, 1 p.o. nightly. Return in 4 weeks.  Verneita Cooks, PA-C

## 2023-11-23 ENCOUNTER — Encounter: Payer: Self-pay | Admitting: Physician Assistant

## 2023-11-23 ENCOUNTER — Ambulatory Visit: Admitting: Physician Assistant

## 2023-11-23 DIAGNOSIS — F3342 Major depressive disorder, recurrent, in full remission: Secondary | ICD-10-CM

## 2023-11-23 DIAGNOSIS — F900 Attention-deficit hyperactivity disorder, predominantly inattentive type: Secondary | ICD-10-CM

## 2023-11-23 MED ORDER — AMPHETAMINE-DEXTROAMPHET ER 20 MG PO CP24: 20.0000 mg | ORAL_CAPSULE | Freq: Every day | ORAL | 0 refills | Status: AC

## 2023-11-23 MED ORDER — AMPHETAMINE-DEXTROAMPHET ER 20 MG PO CP24
20.0000 mg | ORAL_CAPSULE | Freq: Every day | ORAL | 0 refills | Status: AC
Start: 2023-12-07 — End: ?

## 2023-11-23 NOTE — Progress Notes (Signed)
 [facility]  Patient ID: [name],  MRN: [id number]  PCP: Amon Aloysius BRAVO, MD  Date of Evaluation: 11/23/2023 Time spent:20 minutes  Chief Complaint:  Chief Complaint   ADHD; Follow-up    HISTORY/CURRENT STATUS: HPI  For 1 month f/u.   We increased the Adderall at the last visit.  It is working great.  This dose is effective without causing any anxiety or sleep issues. States that attention is good without easy distractibility.  Able to focus on things and finish tasks to completion.   He is able to enjoy things.  Energy and motivation are good.  Work is going well.  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  No extreme sadness, tearfulness, or feelings of hopelessness.  Sleeps well. ADLs and personal hygiene are normal.   Appetite has not changed.  Weight is stable.  No panic attacks.  No complaints of extreme anxiety.  No mania, delirium, AH/VH.  No SI/HI.  Individual Medical History/ Review of Systems: Changes? :No      Past medications for mental health diagnoses include: Lithium , BuSpar, Xanax , Seroquel , Risperdal, Depakote, Zoloft , Strattera  quit working after awhile, Clonidine  caused fatigue and increased depression, Adderall  Hospitalized 10/10/2022 to 10/13/2022 for suicidal thoughts after having stopped Zoloft  and gabapentin  a month before  Allergies: Patient has no known allergies.  Current Medications:  Current Outpatient Medications:    [START ON 01/04/2024] amphetamine -dextroamphetamine  (ADDERALL XR) 20 MG 24 hr capsule, Take 1 capsule (20 mg total) by mouth daily., Disp: 30 capsule, Rfl: 0   [START ON 12/07/2023] amphetamine -dextroamphetamine  (ADDERALL XR) 20 MG 24 hr capsule, Take 1 capsule (20 mg total) by mouth daily., Disp: 30 capsule, Rfl: 0   sertraline  (ZOLOFT ) 100 MG tablet, Take 1 tablet (100 mg total) by mouth daily., Disp: 90 tablet, Rfl: 1   [START ON 02/04/2024] amphetamine -dextroamphetamine  (ADDERALL XR) 20  MG 24 hr capsule, Take 1 capsule (20 mg total) by mouth daily., Disp: 30 capsule, Rfl: 0 Medication Side Effects: none  Family Medical/ Social History: Changes? Works at [employer] now, merchandiser, retail in [employer].    MENTAL HEALTH EXAM:  There were no vitals taken for this visit.There is no height or weight on file to calculate BMI.     General Appearance: Casual, Well Groomed and Obese  Eye Contact:  Good  Speech:  Clear and Coherent and Normal Rate  Volume:  Normal  Mood:  Euthymic  Affect:  Congruent  Thought Process:  logical   Orientation:  Full (Time, Place, and Person)  Thought Content: Logical   Suicidal Thoughts:  No  Homicidal Thoughts:  No  Memory:  WNL  Judgement:  Good  Insight:  Good  Psychomotor Activity:  Normal  Concentration:  Concentration: Good and Attention Span: Good  Recall:  Good  Fund of Knowledge: Good  Language: Good  Assets:  Desire for Improvement Financial Resources/Insurance Housing Resilience Transportation Vocational/Educational  ADL's:  Intact  Cognition: WNL  Prognosis:  Good   DIAGNOSES:    ICD-10-CM   1. Attention deficit hyperactivity disorder (ADHD), predominantly inattentive type  F90.0     2. Major depression, recurrent, full remission  F33.42       Receiving Psychotherapy: No     RECOMMENDATIONS:  PDMP was reviewed.  Adderall filled 11/09/2023. I provided approximately 20 minutes of face to face time during this encounter, including time spent before and after the visit in records review, medical decision making, counseling pertinent  to today's visit, and charting.   Hoy is doing well so no changes are necessary.  Continue Adderall XR  20 mg, 1 p.o. every morning. Continue Zoloft  100 mg, 1 p.o. nightly. Return in 3 months.   Verneita Cooks, PA-C

## 2024-02-23 ENCOUNTER — Ambulatory Visit: Payer: Self-pay | Admitting: Physician Assistant
